# Patient Record
Sex: Male | Born: 1950 | Race: White | Hispanic: No | Marital: Married | Smoking: Never smoker
Health system: Southern US, Community
[De-identification: ages and names within clinical notes are randomized; demographics above are authoritative.]

## PROBLEM LIST (undated history)

## (undated) DIAGNOSIS — C61 Malignant neoplasm of prostate: Secondary | ICD-10-CM

## (undated) DIAGNOSIS — I10 Essential (primary) hypertension: Secondary | ICD-10-CM

## (undated) DIAGNOSIS — E785 Hyperlipidemia, unspecified: Secondary | ICD-10-CM

---

## 2020-01-15 ENCOUNTER — Inpatient Hospital Stay (HOSPITAL_COMMUNITY): Admission: EM | Disposition: E | Payer: Self-pay | Source: Home / Self Care | Attending: Critical Care Medicine

## 2020-01-15 ENCOUNTER — Encounter (HOSPITAL_COMMUNITY): Payer: Self-pay | Admitting: Critical Care Medicine

## 2020-01-15 ENCOUNTER — Inpatient Hospital Stay (HOSPITAL_COMMUNITY)
Admission: EM | Admit: 2020-01-15 | Discharge: 2020-02-14 | DRG: 246 | Disposition: E | Payer: Medicare Other | Attending: Critical Care Medicine | Admitting: Critical Care Medicine

## 2020-01-15 ENCOUNTER — Emergency Department (HOSPITAL_COMMUNITY): Payer: Medicare Other

## 2020-01-15 ENCOUNTER — Inpatient Hospital Stay (HOSPITAL_COMMUNITY): Payer: Medicare Other

## 2020-01-15 DIAGNOSIS — D649 Anemia, unspecified: Secondary | ICD-10-CM | POA: Diagnosis present

## 2020-01-15 DIAGNOSIS — N179 Acute kidney failure, unspecified: Secondary | ICD-10-CM | POA: Diagnosis present

## 2020-01-15 DIAGNOSIS — J9601 Acute respiratory failure with hypoxia: Secondary | ICD-10-CM | POA: Diagnosis present

## 2020-01-15 DIAGNOSIS — I4891 Unspecified atrial fibrillation: Secondary | ICD-10-CM | POA: Diagnosis present

## 2020-01-15 DIAGNOSIS — G40401 Other generalized epilepsy and epileptic syndromes, not intractable, with status epilepticus: Secondary | ICD-10-CM | POA: Diagnosis present

## 2020-01-15 DIAGNOSIS — I4901 Ventricular fibrillation: Secondary | ICD-10-CM

## 2020-01-15 DIAGNOSIS — Z9911 Dependence on respirator [ventilator] status: Secondary | ICD-10-CM | POA: Diagnosis not present

## 2020-01-15 DIAGNOSIS — E876 Hypokalemia: Secondary | ICD-10-CM | POA: Diagnosis not present

## 2020-01-15 DIAGNOSIS — Z20822 Contact with and (suspected) exposure to covid-19: Secondary | ICD-10-CM | POA: Diagnosis present

## 2020-01-15 DIAGNOSIS — I2109 ST elevation (STEMI) myocardial infarction involving other coronary artery of anterior wall: Principal | ICD-10-CM | POA: Diagnosis present

## 2020-01-15 DIAGNOSIS — E872 Acidosis: Secondary | ICD-10-CM | POA: Diagnosis present

## 2020-01-15 DIAGNOSIS — I5021 Acute systolic (congestive) heart failure: Secondary | ICD-10-CM | POA: Diagnosis present

## 2020-01-15 DIAGNOSIS — I959 Hypotension, unspecified: Secondary | ICD-10-CM | POA: Diagnosis not present

## 2020-01-15 DIAGNOSIS — I213 ST elevation (STEMI) myocardial infarction of unspecified site: Secondary | ICD-10-CM

## 2020-01-15 DIAGNOSIS — I469 Cardiac arrest, cause unspecified: Secondary | ICD-10-CM | POA: Diagnosis not present

## 2020-01-15 DIAGNOSIS — I110 Hypertensive heart disease with heart failure: Secondary | ICD-10-CM | POA: Diagnosis present

## 2020-01-15 DIAGNOSIS — N400 Enlarged prostate without lower urinary tract symptoms: Secondary | ICD-10-CM | POA: Diagnosis present

## 2020-01-15 DIAGNOSIS — R7303 Prediabetes: Secondary | ICD-10-CM | POA: Diagnosis present

## 2020-01-15 DIAGNOSIS — E778 Other disorders of glycoprotein metabolism: Secondary | ICD-10-CM | POA: Diagnosis present

## 2020-01-15 DIAGNOSIS — G931 Anoxic brain damage, not elsewhere classified: Secondary | ICD-10-CM | POA: Diagnosis present

## 2020-01-15 DIAGNOSIS — E8809 Other disorders of plasma-protein metabolism, not elsewhere classified: Secondary | ICD-10-CM | POA: Diagnosis present

## 2020-01-15 DIAGNOSIS — G40901 Epilepsy, unspecified, not intractable, with status epilepticus: Secondary | ICD-10-CM | POA: Diagnosis not present

## 2020-01-15 DIAGNOSIS — E782 Mixed hyperlipidemia: Secondary | ICD-10-CM | POA: Diagnosis present

## 2020-01-15 DIAGNOSIS — Z66 Do not resuscitate: Secondary | ICD-10-CM | POA: Diagnosis not present

## 2020-01-15 DIAGNOSIS — I2102 ST elevation (STEMI) myocardial infarction involving left anterior descending coronary artery: Secondary | ICD-10-CM | POA: Diagnosis not present

## 2020-01-15 DIAGNOSIS — R9431 Abnormal electrocardiogram [ECG] [EKG]: Secondary | ICD-10-CM | POA: Diagnosis present

## 2020-01-15 DIAGNOSIS — G253 Myoclonus: Secondary | ICD-10-CM | POA: Diagnosis not present

## 2020-01-15 DIAGNOSIS — I472 Ventricular tachycardia: Secondary | ICD-10-CM | POA: Diagnosis not present

## 2020-01-15 DIAGNOSIS — Z515 Encounter for palliative care: Secondary | ICD-10-CM

## 2020-01-15 DIAGNOSIS — Z8546 Personal history of malignant neoplasm of prostate: Secondary | ICD-10-CM

## 2020-01-15 DIAGNOSIS — I2510 Atherosclerotic heart disease of native coronary artery without angina pectoris: Secondary | ICD-10-CM

## 2020-01-15 DIAGNOSIS — Z79899 Other long term (current) drug therapy: Secondary | ICD-10-CM

## 2020-01-15 DIAGNOSIS — I462 Cardiac arrest due to underlying cardiac condition: Secondary | ICD-10-CM | POA: Diagnosis present

## 2020-01-15 HISTORY — DX: Malignant neoplasm of prostate: C61

## 2020-01-15 HISTORY — PX: CORONARY/GRAFT ACUTE MI REVASCULARIZATION: CATH118305

## 2020-01-15 HISTORY — PX: LEFT HEART CATH AND CORONARY ANGIOGRAPHY: CATH118249

## 2020-01-15 HISTORY — DX: Hyperlipidemia, unspecified: E78.5

## 2020-01-15 HISTORY — DX: Essential (primary) hypertension: I10

## 2020-01-15 HISTORY — PX: CORONARY STENT INTERVENTION: CATH118234

## 2020-01-15 LAB — CBC WITH DIFFERENTIAL/PLATELET
Abs Immature Granulocytes: 0.03 10*3/uL (ref 0.00–0.07)
Basophils Absolute: 0.1 10*3/uL (ref 0.0–0.1)
Basophils Relative: 1 %
Eosinophils Absolute: 0.1 10*3/uL (ref 0.0–0.5)
Eosinophils Relative: 1 %
HCT: 38.7 % — ABNORMAL LOW (ref 39.0–52.0)
Hemoglobin: 12.8 g/dL — ABNORMAL LOW (ref 13.0–17.0)
Immature Granulocytes: 0 %
Lymphocytes Relative: 31 %
Lymphs Abs: 2.8 10*3/uL (ref 0.7–4.0)
MCH: 32.5 pg (ref 26.0–34.0)
MCHC: 33.1 g/dL (ref 30.0–36.0)
MCV: 98.2 fL (ref 80.0–100.0)
Monocytes Absolute: 1 10*3/uL (ref 0.1–1.0)
Monocytes Relative: 11 %
Neutro Abs: 5.2 10*3/uL (ref 1.7–7.7)
Neutrophils Relative %: 56 %
Platelets: 132 10*3/uL — ABNORMAL LOW (ref 150–400)
RBC: 3.94 MIL/uL — ABNORMAL LOW (ref 4.22–5.81)
RDW: 13.8 % (ref 11.5–15.5)
WBC: 9.3 10*3/uL (ref 4.0–10.5)
nRBC: 0 % (ref 0.0–0.2)

## 2020-01-15 LAB — COMPREHENSIVE METABOLIC PANEL
ALT: 139 U/L — ABNORMAL HIGH (ref 0–44)
AST: 170 U/L — ABNORMAL HIGH (ref 15–41)
Albumin: 3.5 g/dL (ref 3.5–5.0)
Alkaline Phosphatase: 45 U/L (ref 38–126)
Anion gap: 9 (ref 5–15)
BUN: 34 mg/dL — ABNORMAL HIGH (ref 8–23)
CO2: 24 mmol/L (ref 22–32)
Calcium: 8.6 mg/dL — ABNORMAL LOW (ref 8.9–10.3)
Chloride: 106 mmol/L (ref 98–111)
Creatinine, Ser: 1.35 mg/dL — ABNORMAL HIGH (ref 0.61–1.24)
GFR calc Af Amer: >60 mL/min (ref >60)
GFR calc non Af Amer: 53 mL/min — ABNORMAL LOW (ref >60)
Glucose, Bld: 155 mg/dL — ABNORMAL HIGH (ref 70–99)
Potassium: 3.3 mmol/L — ABNORMAL LOW (ref 3.5–5.1)
Sodium: 139 mmol/L (ref 135–145)
Total Bilirubin: 0.5 mg/dL (ref 0.3–1.2)
Total Protein: 6.1 g/dL — ABNORMAL LOW (ref 6.5–8.1)

## 2020-01-15 LAB — POCT I-STAT EG7
Acid-base deficit: 3 mmol/L — ABNORMAL HIGH (ref 0.0–2.0)
Bicarbonate: 22.8 mmol/L (ref 20.0–28.0)
Calcium, Ion: 1.17 mmol/L (ref 1.15–1.40)
HCT: 36 % — ABNORMAL LOW (ref 39.0–52.0)
Hemoglobin: 12.2 g/dL — ABNORMAL LOW (ref 13.0–17.0)
O2 Saturation: 77 %
Patient temperature: 36.1
Potassium: 3.6 mmol/L (ref 3.5–5.1)
Sodium: 139 mmol/L (ref 135–145)
TCO2: 24 mmol/L (ref 22–32)
pCO2, Ven: 39.7 mmHg — ABNORMAL LOW (ref 44.0–60.0)
pH, Ven: 7.363 (ref 7.250–7.430)
pO2, Ven: 41 mmHg (ref 32.0–45.0)

## 2020-01-15 LAB — BASIC METABOLIC PANEL
Anion gap: 14 (ref 5–15)
BUN: 36 mg/dL — ABNORMAL HIGH (ref 8–23)
CO2: 21 mmol/L — ABNORMAL LOW (ref 22–32)
Calcium: 8.6 mg/dL — ABNORMAL LOW (ref 8.9–10.3)
Chloride: 103 mmol/L (ref 98–111)
Creatinine, Ser: 1.77 mg/dL — ABNORMAL HIGH (ref 0.61–1.24)
GFR calc Af Amer: 44 mL/min — ABNORMAL LOW (ref >60)
GFR calc non Af Amer: 38 mL/min — ABNORMAL LOW (ref >60)
Glucose, Bld: 255 mg/dL — ABNORMAL HIGH (ref 70–99)
Potassium: 3.5 mmol/L (ref 3.5–5.1)
Sodium: 138 mmol/L (ref 135–145)

## 2020-01-15 LAB — CBC
HCT: 37 % — ABNORMAL LOW (ref 39.0–52.0)
Hemoglobin: 12.1 g/dL — ABNORMAL LOW (ref 13.0–17.0)
MCH: 31.3 pg (ref 26.0–34.0)
MCHC: 32.7 g/dL (ref 30.0–36.0)
MCV: 95.9 fL (ref 80.0–100.0)
Platelets: 199 10*3/uL (ref 150–400)
RBC: 3.86 MIL/uL — ABNORMAL LOW (ref 4.22–5.81)
RDW: 13.9 % (ref 11.5–15.5)
WBC: 12.3 10*3/uL — ABNORMAL HIGH (ref 4.0–10.5)
nRBC: 0 % (ref 0.0–0.2)

## 2020-01-15 LAB — RESPIRATORY PANEL BY RT PCR (FLU A&B, COVID)
Influenza A by PCR: NEGATIVE
Influenza B by PCR: NEGATIVE
SARS Coronavirus 2 by RT PCR: NEGATIVE

## 2020-01-15 LAB — TROPONIN I (HIGH SENSITIVITY): Troponin I (High Sensitivity): 330 ng/L (ref <18)

## 2020-01-15 LAB — LACTIC ACID, PLASMA
Lactic Acid, Venous: 3.7 mmol/L (ref 0.5–1.9)
Lactic Acid, Venous: 2.8 mmol/L (ref 0.5–1.9)

## 2020-01-15 LAB — GLUCOSE, CAPILLARY
Glucose-Capillary: 223 mg/dL — ABNORMAL HIGH (ref 70–99)
Glucose-Capillary: 238 mg/dL — ABNORMAL HIGH (ref 70–99)

## 2020-01-15 LAB — APTT: aPTT: >200 seconds (ref 24–36)

## 2020-01-15 LAB — CBG MONITORING, ED: Glucose-Capillary: 147 mg/dL — ABNORMAL HIGH (ref 70–99)

## 2020-01-15 LAB — PROTIME-INR
INR: 1.1 (ref 0.8–1.2)
INR: 1.2 (ref 0.8–1.2)
Prothrombin Time: 13.6 seconds (ref 11.4–15.2)
Prothrombin Time: 15.1 seconds (ref 11.4–15.2)

## 2020-01-15 LAB — MAGNESIUM: Magnesium: 1.9 mg/dL (ref 1.7–2.4)

## 2020-01-15 SURGERY — CORONARY/GRAFT ACUTE MI REVASCULARIZATION
Anesthesia: LOCAL

## 2020-01-15 MED ORDER — TIROFIBAN HCL IV 12.5 MG/250 ML
0.0750 ug/kg/min | INTRAVENOUS | Status: AC
  Administered 2020-01-15: 0.075 ug/kg/min via INTRAVENOUS
  Filled 2020-01-15: qty 250

## 2020-01-15 MED ORDER — VERAPAMIL HCL 2.5 MG/ML IV SOLN
INTRAVENOUS | Status: DC | PRN
  Administered 2020-01-15: 10 mL via INTRA_ARTERIAL

## 2020-01-15 MED ORDER — HEPARIN SODIUM (PORCINE) 1000 UNIT/ML IJ SOLN
INTRAMUSCULAR | Status: DC | PRN
  Administered 2020-01-15: 3000 [IU] via INTRAVENOUS
  Administered 2020-01-15: 4000 [IU] via INTRAVENOUS

## 2020-01-15 MED ORDER — MAGNESIUM SULFATE 50 % IJ SOLN: 2.0000 g | Freq: Once | INTRAMUSCULAR | Status: DC

## 2020-01-15 MED ORDER — ORAL CARE MOUTH RINSE
15.0000 mL | OROMUCOSAL | Status: DC
  Administered 2020-01-15 – 2020-01-17 (×19): 15 mL via OROMUCOSAL

## 2020-01-15 MED ORDER — HEPARIN (PORCINE) 25000 UT/250ML-% IV SOLN
INTRAVENOUS | Status: AC
  Filled 2020-01-15: qty 250

## 2020-01-15 MED ORDER — HEPARIN BOLUS VIA INFUSION
4000.0000 [IU] | Freq: Once | INTRAVENOUS | Status: AC
  Administered 2020-01-15: 4000 [IU] via INTRAVENOUS

## 2020-01-15 MED ORDER — MAGNESIUM SULFATE 2 GM/50ML IV SOLN: 2.0000 g | Freq: Once | INTRAVENOUS | Status: AC

## 2020-01-15 MED ORDER — TIROFIBAN HCL IN NACL 5-0.9 MG/100ML-% IV SOLN
INTRAVENOUS | Status: AC
  Filled 2020-01-15: qty 100

## 2020-01-15 MED ORDER — INSULIN ASPART 100 UNIT/ML ~~LOC~~ SOLN
1.0000 [IU] | SUBCUTANEOUS | Status: DC
  Administered 2020-01-15: 3 [IU] via SUBCUTANEOUS
  Administered 2020-01-16: 1 [IU] via SUBCUTANEOUS
  Administered 2020-01-16: 3 [IU] via SUBCUTANEOUS
  Administered 2020-01-16 – 2020-01-17 (×3): 1 [IU] via SUBCUTANEOUS

## 2020-01-15 MED ORDER — NITROGLYCERIN 1 MG/10 ML FOR IR/CATH LAB
INTRA_ARTERIAL | Status: AC
  Filled 2020-01-15: qty 10

## 2020-01-15 MED ORDER — AMIODARONE HCL 150 MG/3ML IV SOLN
INTRAVENOUS | Status: AC | PRN
  Administered 2020-01-15: 300 mg via INTRAVENOUS

## 2020-01-15 MED ORDER — MIDAZOLAM HCL 2 MG/2ML IJ SOLN
INTRAMUSCULAR | Status: DC | PRN
  Administered 2020-01-15: 2 mg via INTRAVENOUS

## 2020-01-15 MED ORDER — VERAPAMIL HCL 2.5 MG/ML IV SOLN
INTRAVENOUS | Status: DC | PRN
  Administered 2020-01-15 (×3): 200 ug via INTRACORONARY

## 2020-01-15 MED ORDER — AMIODARONE HCL IN DEXTROSE 360-4.14 MG/200ML-% IV SOLN
INTRAVENOUS | Status: AC
  Filled 2020-01-15: qty 200

## 2020-01-15 MED ORDER — FENTANYL CITRATE (PF) 100 MCG/2ML IJ SOLN: 25.0000 ug | INTRAMUSCULAR | Status: DC | PRN

## 2020-01-15 MED ORDER — MAGNESIUM SULFATE 2 GM/50ML IV SOLN: 2.0000 g | Freq: Once | INTRAVENOUS | Status: DC

## 2020-01-15 MED ORDER — FENTANYL CITRATE (PF) 100 MCG/2ML IJ SOLN
25.0000 ug | INTRAMUSCULAR | Status: DC | PRN
  Administered 2020-01-15: 50 ug via INTRAVENOUS
  Filled 2020-01-15: qty 2

## 2020-01-15 MED ORDER — DOCUSATE SODIUM 50 MG/5ML PO LIQD
100.0000 mg | Freq: Two times a day (BID) | ORAL | Status: DC
  Administered 2020-01-16 – 2020-01-17 (×2): 100 mg
  Filled 2020-01-15 (×2): qty 10

## 2020-01-15 MED ORDER — SODIUM CHLORIDE 0.9 % IV SOLN
3.0000 g | Freq: Four times a day (QID) | INTRAVENOUS | Status: DC
  Administered 2020-01-15 – 2020-01-17 (×8): 3 g via INTRAVENOUS
  Filled 2020-01-15 (×2): qty 8
  Filled 2020-01-15 (×3): qty 3
  Filled 2020-01-15 (×6): qty 8
  Filled 2020-01-15: qty 3
  Filled 2020-01-15: qty 8

## 2020-01-15 MED ORDER — HEPARIN (PORCINE) 25000 UT/250ML-% IV SOLN
1000.0000 [IU]/h | INTRAVENOUS | Status: DC
  Filled 2020-01-15: qty 250

## 2020-01-15 MED ORDER — ASPIRIN 300 MG RE SUPP
300.0000 mg | Freq: Once | RECTAL | Status: AC
  Administered 2020-01-15: 300 mg via RECTAL

## 2020-01-15 MED ORDER — IOHEXOL 350 MG/ML SOLN
INTRAVENOUS | Status: AC
  Filled 2020-01-15: qty 1

## 2020-01-15 MED ORDER — TIROFIBAN HCL IN NACL 5-0.9 MG/100ML-% IV SOLN
INTRAVENOUS | Status: AC | PRN
  Administered 2020-01-15: 0.15 ug/kg/min via INTRAVENOUS

## 2020-01-15 MED ORDER — PROPOFOL 1000 MG/100ML IV EMUL
0.0000 ug/kg/min | INTRAVENOUS | Status: DC
  Administered 2020-01-15: 5 ug/kg/min via INTRAVENOUS
  Administered 2020-01-16 (×3): 35 ug/kg/min via INTRAVENOUS
  Administered 2020-01-16: 25 ug/kg/min via INTRAVENOUS
  Administered 2020-01-17: 40 ug/kg/min via INTRAVENOUS
  Administered 2020-01-17: 39.925 ug/kg/min via INTRAVENOUS
  Administered 2020-01-17 (×2): 40 ug/kg/min via INTRAVENOUS
  Filled 2020-01-15 (×2): qty 100
  Filled 2020-01-15: qty 200
  Filled 2020-01-15 (×6): qty 100

## 2020-01-15 MED ORDER — SODIUM CHLORIDE 0.9 % IV SOLN
2000.0000 mg | Freq: Once | INTRAVENOUS | Status: AC
  Administered 2020-01-15: 2000 mg via INTRAVENOUS
  Filled 2020-01-15: qty 20

## 2020-01-15 MED ORDER — EPINEPHRINE 1 MG/10ML IJ SOSY
PREFILLED_SYRINGE | INTRAMUSCULAR | Status: AC | PRN
  Administered 2020-01-15: 1 mg via INTRAVENOUS

## 2020-01-15 MED ORDER — PANTOPRAZOLE SODIUM 40 MG IV SOLR
40.0000 mg | Freq: Every day | INTRAVENOUS | Status: DC
  Administered 2020-01-15 – 2020-01-17 (×3): 40 mg via INTRAVENOUS
  Filled 2020-01-15 (×3): qty 40

## 2020-01-15 MED ORDER — LIDOCAINE HCL (PF) 1 % IJ SOLN
INTRAMUSCULAR | Status: AC
  Filled 2020-01-15: qty 30

## 2020-01-15 MED ORDER — NOREPINEPHRINE 4 MG/250ML-% IV SOLN: 0.0000 ug/min | INTRAVENOUS | Status: DC

## 2020-01-15 MED ORDER — VERAPAMIL HCL 2.5 MG/ML IV SOLN
INTRAVENOUS | Status: AC
  Filled 2020-01-15: qty 2

## 2020-01-15 MED ORDER — SODIUM CHLORIDE 0.9 % IV SOLN: INTRAVENOUS | Status: DC

## 2020-01-15 MED ORDER — DOCUSATE SODIUM 50 MG/5ML PO LIQD: 100.0000 mg | Freq: Two times a day (BID) | ORAL | Status: DC

## 2020-01-15 MED ORDER — HEPARIN SODIUM (PORCINE) 1000 UNIT/ML IJ SOLN
INTRAMUSCULAR | Status: AC
  Filled 2020-01-15: qty 1

## 2020-01-15 MED ORDER — ATORVASTATIN CALCIUM 40 MG PO TABS
40.0000 mg | ORAL_TABLET | Freq: Every day | ORAL | Status: DC
  Administered 2020-01-16 – 2020-01-17 (×2): 40 mg
  Filled 2020-01-15 (×2): qty 1

## 2020-01-15 MED ORDER — POLYETHYLENE GLYCOL 3350 17 G PO PACK: 17.0000 g | PACK | Freq: Every day | ORAL | Status: DC

## 2020-01-15 MED ORDER — MIDAZOLAM HCL 2 MG/2ML IJ SOLN
INTRAMUSCULAR | Status: AC
  Filled 2020-01-15: qty 2

## 2020-01-15 MED ORDER — CHLORHEXIDINE GLUCONATE 0.12% ORAL RINSE (MEDLINE KIT)
15.0000 mL | Freq: Two times a day (BID) | OROMUCOSAL | Status: DC
  Administered 2020-01-15 – 2020-01-17 (×4): 15 mL via OROMUCOSAL

## 2020-01-15 MED ORDER — HEPARIN (PORCINE) IN NACL 1000-0.9 UT/500ML-% IV SOLN
INTRAVENOUS | Status: DC | PRN
  Administered 2020-01-15 (×2): 500 mL

## 2020-01-15 MED ORDER — ASPIRIN 300 MG RE SUPP: 300.0000 mg | RECTAL | Status: DC

## 2020-01-15 MED ORDER — HEPARIN (PORCINE) 25000 UT/250ML-% IV SOLN
12.0000 [IU]/kg/h | INTRAVENOUS | Status: DC
  Administered 2020-01-15: 12 [IU]/kg/h via INTRAVENOUS

## 2020-01-15 MED ORDER — EPINEPHRINE 1 MG/10ML IJ SOSY
PREFILLED_SYRINGE | INTRAMUSCULAR | Status: AC | PRN
  Administered 2020-01-15 (×2): 1 mg via INTRAVENOUS

## 2020-01-15 MED ORDER — AMIODARONE HCL IN DEXTROSE 360-4.14 MG/200ML-% IV SOLN: 30.0000 mg/h | INTRAVENOUS | Status: DC

## 2020-01-15 MED ORDER — IOHEXOL 350 MG/ML SOLN
INTRAVENOUS | Status: DC | PRN
  Administered 2020-01-15: 120 mL

## 2020-01-15 MED ORDER — NITROGLYCERIN 1 MG/10 ML FOR IR/CATH LAB
INTRA_ARTERIAL | Status: DC | PRN
  Administered 2020-01-15: 100 ug via INTRACORONARY

## 2020-01-15 MED ORDER — HEPARIN SODIUM (PORCINE) 5000 UNIT/ML IJ SOLN: 5000.0000 [IU] | Freq: Three times a day (TID) | INTRAMUSCULAR | Status: DC

## 2020-01-15 MED ORDER — LEVETIRACETAM IN NACL 500 MG/100ML IV SOLN
500.0000 mg | Freq: Two times a day (BID) | INTRAVENOUS | Status: DC
  Administered 2020-01-16: 500 mg via INTRAVENOUS
  Filled 2020-01-15: qty 100

## 2020-01-15 MED ORDER — MAGNESIUM SULFATE 2 GM/50ML IV SOLN
INTRAVENOUS | Status: AC
  Administered 2020-01-15: 2 g via INTRAVENOUS
  Filled 2020-01-15: qty 50

## 2020-01-15 MED ORDER — TIROFIBAN (AGGRASTAT) BOLUS VIA INFUSION
INTRAVENOUS | Status: DC | PRN
  Administered 2020-01-15: 2212.5 ug via INTRAVENOUS

## 2020-01-15 MED ORDER — HEPARIN (PORCINE) IN NACL 1000-0.9 UT/500ML-% IV SOLN
INTRAVENOUS | Status: AC
  Filled 2020-01-15: qty 1000

## 2020-01-15 MED ORDER — AMIODARONE HCL IN DEXTROSE 360-4.14 MG/200ML-% IV SOLN
60.0000 mg/h | INTRAVENOUS | Status: DC
  Administered 2020-01-15: 60 mg/h via INTRAVENOUS

## 2020-01-15 MED ORDER — POTASSIUM CHLORIDE 10 MEQ/100ML IV SOLN
10.0000 meq | INTRAVENOUS | Status: AC
  Administered 2020-01-15 – 2020-01-16 (×4): 10 meq via INTRAVENOUS
  Filled 2020-01-15 (×4): qty 100

## 2020-01-15 MED ORDER — TICAGRELOR 90 MG PO TABS
180.0000 mg | ORAL_TABLET | Freq: Once | ORAL | Status: AC
  Administered 2020-01-15: 180 mg
  Filled 2020-01-15: qty 2

## 2020-01-15 MED ORDER — POLYETHYLENE GLYCOL 3350 17 G PO PACK
17.0000 g | PACK | Freq: Every day | ORAL | Status: DC
  Administered 2020-01-17: 17 g
  Filled 2020-01-15: qty 1

## 2020-01-15 SURGICAL SUPPLY — 20 items
BALLN  ~~LOC~~ SAPPHIRE 4.5X12 (BALLOONS) ×1
BALLN SAPPHIRE 2.5X12 (BALLOONS) ×2
BALLN ~~LOC~~ SAPPHIRE 4.5X12 (BALLOONS) ×1
BALLOON SAPPHIRE 2.5X12 (BALLOONS) ×1 IMPLANT
BALLOON ~~LOC~~ SAPPHIRE 4.5X12 (BALLOONS) ×1 IMPLANT
CATH 5FR JL3.5 JR4 ANG PIG MP (CATHETERS) ×2 IMPLANT
CATH LAUNCHER 6FR EBU3.5 (CATHETERS) ×2 IMPLANT
DEVICE RAD COMP TR BAND LRG (VASCULAR PRODUCTS) ×2 IMPLANT
GLIDESHEATH SLEND SS 6F .021 (SHEATH) ×2 IMPLANT
GUIDEWIRE INQWIRE 1.5J.035X260 (WIRE) ×1 IMPLANT
INQWIRE 1.5J .035X260CM (WIRE) ×2
KIT ENCORE 26 ADVANTAGE (KITS) ×2 IMPLANT
KIT HEART LEFT (KITS) ×2 IMPLANT
KIT HEMO VALVE WATCHDOG (MISCELLANEOUS) IMPLANT
PACK CARDIAC CATHETERIZATION (CUSTOM PROCEDURE TRAY) ×2 IMPLANT
STENT RESOLUTE ONYX 4.0X15 (Permanent Stent) ×2 IMPLANT
SYR MEDRAD MARK 7 150ML (SYRINGE) ×2 IMPLANT
TRANSDUCER W/STOPCOCK (MISCELLANEOUS) ×2 IMPLANT
TUBING CIL FLEX 10 FLL-RA (TUBING) ×2 IMPLANT
WIRE COUGAR XT STRL 190CM (WIRE) ×2 IMPLANT

## 2020-01-15 NOTE — Code Documentation (Signed)
c-collar applied  

## 2020-01-15 NOTE — Consult Note (Signed)
Cardiology Consultation:   Patient ID: Benjamin Montes MRN: [id number]; DOB: 1950/07/30   Admission date: 01/21/2020  Primary Care Provider: Pcp, No [facility] Cardiologist: No primary care provider on file.  [facility] Electrophysiologist:  None   Chief Complaint:  Cardiac Arrest  Patient Profile:   Benjamin Montes is a 69 y.o. male with no known medical history presents with out of hospital cardiac arrest, consultation requested by Dr Sabra Heck ([place])  History of Present Illness:   Benjamin Montes was reportedly hiking in the [facility] yesterday.  He is from [place] and was traveling with friends.  He had a fall yesterday and hit his face and head, but he sought evaluation at the local emergency department with a CAT scan showing no significant abnormality per report.  The patient was riding in the backseat of a car today when he suddenly became unresponsive.  His friends immediately called 38 and decided to drive to [facility] because they were only about 10 minutes away.  They were unable to administer CPR in route.  Immediately upon arrival at the emergency department front door, the patient was pulled from the car where he was administered CPR, given epinephrine, and defibrillated as his initial rhythm was ventricular fibrillation.  On his immediate post resuscitation EKG, there is diffuse ST segment elevation and a wide QRS complex.  A code STEMI is called.  I discussed the case with Dr. Sabra Heck in the emergency department and the patient was appropriately sent back to the CT scanner to rule out an intracranial hemorrhage.  This was negative.  A repeat EKG was performed once he was back from the CT scanner and his ST segments were isoelectric.  After review of the situation, I elected to proceed with the emergent cardiac catheterization with high suspicion for acute coronary occlusion as the culprit of his ventricular fibrillation cardiac arrest.  No other history is  obtainable at the time of my evaluation.  The patient presents to the cardiac catheterization lab at [facility] via EMS.  The patient is comatose, unable to provide any history.   Past Medical History:  Diagnosis Date  . Prostate cancer [facility])     History reviewed. No pertinent surgical history.   Medications Prior to Admission: Prior to Admission medications   Medication Sig Start Date End Date Taking? Authorizing Provider  amLODipine (NORVASC) 5 MG tablet Take 1 tablet by mouth daily.    [provider]  Cholecalciferol (VITAMIN D3) 1.25 MG (50000 UT) CAPS Take 1 tablet by mouth daily.    [provider]  finasteride (PROSCAR) 5 MG tablet Take 1 tablet by mouth daily.    [provider]  latanoprost (XALATAN) 0.005 % ophthalmic solution Place 1 drop into both eyes daily. 01/12/20   [provider]  lisinopril (ZESTRIL) 30 MG tablet Take 1 tablet by mouth daily.    [provider]  pravastatin (PRAVACHOL) 40 MG tablet Take 40 mg by mouth daily.    [provider]     Allergies:   Not on File  Social History:   Social History   Socioeconomic History  . Marital status: Married    Spouse name: Not on file  . Number of children: Not on file  . Years of education: Not on file  . Highest education level: Not on file  Occupational History  . Not on file  Tobacco Use  . Smoking status: Never Smoker  . Smokeless tobacco: Never Used  Substance and Sexual Activity  . Alcohol use: Not on file  . Drug use: Not on file  . Sexual activity: Not on file  Other Topics Concern  . Not on file  Social History Narrative  . Not on file   Social Determinants of Health   Financial Resource Strain:   . Difficulty of Paying Living Expenses: Not on file  Food Insecurity:   . Worried About Charity fundraiser in the Last Year: Not on file  . Ran Out of Food in the Last Year: Not on file  Transportation Needs:   . Lack of Transportation  (Medical): Not on file  . Lack of Transportation (Non-Medical): Not on file  Physical Activity:   . Days of Exercise per Week: Not on file  . Minutes of Exercise per Session: Not on file  Stress:   . Feeling of Stress : Not on file  Social Connections:   . Frequency of Communication with Friends and Family: Not on file  . Frequency of Social Gatherings with Friends and Family: Not on file  . Attends Religious Services: Not on file  . Active Member of Clubs or Organizations: Not on file  . Attends Archivist Meetings: Not on file  . Marital Status: Not on file  Intimate Partner Violence:   . Fear of Current or Ex-Partner: Not on file  . Emotionally Abused: Not on file  . Physically Abused: Not on file  . Sexually Abused: Not on file    Family History:   The patient's family history is not on file.  Unable to obtain secondary to the patient's clinical state.  ROS:  Please see the history of present illness.  Unable to obtain secondary to comatose patient  Physical Exam/Data:   Vitals:   01/22/2020 1728 01/16/2020 1738 01/16/2020 1743 02/07/2020 1748  BP: (!) 93/54 (!) 91/59 (!) 96/58 107/64  Pulse: (!) 52 (!) 51 (!) 56 (!) 54  Resp: 15 15 13 14   Temp:      SpO2: 100% 100% 100% 100%  Weight:      Height:       No intake or output data in the 24 hours ending 02/10/2020 1830 Last 3 Weights 01/22/2020  Weight (lbs) 195 lb  Weight (kg) 88.451 kg     Body mass index is 26.45 kg/m.  General: Unresponsive, c-collar in place HEENT: normal. Facial bruising noted Lymph: no adenopathy Neck: Unable to evaluate for JVD or carotid bruits with c-collar in place Vascular: unable to evaluate carotids. Pedal pulses 2+=; FA pulses 2+ bilaterally  Cardiac:  normal S1, S2; RRR; no murmur  Lungs:  clear to auscultation bilaterally, no wheezing, rhonchi or rales  Abd: soft, nontender, no hepatomegaly  Ext: no edema Musculoskeletal:  No deformities Skin: warm and dry  Neuro:   unresponsive Psych:  Unable to assess    EKG:  The ECG that was done was personally reviewed and demonstrates sinus bradycardia, prolonged QT, age undetermined anteroseptal infarct  Relevant CV Studies: Pending  Laboratory Data:  High Sensitivity Troponin:   Recent Labs  Lab 01/29/2020 1523  TROPONINIHS 330*      Chemistry Recent Labs  Lab 02/08/2020 1523  NA 139  K 3.3*  CL 106  CO2 24  GLUCOSE 155*  BUN 34*  CREATININE 1.35*  CALCIUM 8.6*  GFRNONAA 53*  GFRAA >60  ANIONGAP 9    Recent Labs  Lab 01/19/2020 1523  PROT 6.1*  ALBUMIN 3.5  AST 170*  ALT 139*  ALKPHOS 45  BILITOT 0.5   Hematology Recent Labs  Lab 01/16/2020 1523  WBC 9.3  RBC 3.94*  HGB 12.8*  HCT 38.7*  MCV 98.2  MCH 32.5  MCHC 33.1  RDW 13.8  PLT 132*   BNPNo results for input(s): BNP, PROBNP in the last 168 hours.  DDimer No results for input(s): DDIMER in the last 168 hours.   Radiology/Studies:  CT Head Wo Contrast  Result Date: 02/09/2020 CLINICAL DATA:  Status post fall with a blow to the head while hiking yesterday. The patient became unresponsive today. Initial encounter. EXAM: CT HEAD WITHOUT CONTRAST CT CERVICAL SPINE WITHOUT CONTRAST TECHNIQUE: Multidetector CT imaging of the head and cervical spine was performed following the standard protocol without intravenous contrast. Multiplanar CT image reconstructions of the cervical spine were also generated. COMPARISON:  None. FINDINGS: CT HEAD FINDINGS Brain: No evidence of acute infarction, hemorrhage, hydrocephalus, extra-axial collection or mass lesion/mass effect. Vascular: No hyperdense vessel or unexpected calcification. Skull: Normal. Negative for fracture or focal lesion. Sinuses/Orbits: Negative. Other: Endotracheal tube and OG tube noted. CT CERVICAL SPINE FINDINGS Alignment: Maintained. Skull base and vertebrae: No acute fracture. No primary bone lesion or focal pathologic process. Soft tissues and spinal canal: No prevertebral  fluid or swelling. No visible canal hematoma. Disc levels:  Loss of disc space height is seen from C3-C7. Upper chest: Lung apices clear. Other: None. IMPRESSION: Negative head CT. No acute abnormality cervical spondylosis. Multilevel degenerative disc disease. Electronically Signed   By: Inge Rise M.D.   On: 01/28/2020 15:51   CT Cervical Spine Wo Contrast  Result Date: 02/05/2020 CLINICAL DATA:  Status post fall with a blow to the head while hiking yesterday. The patient became unresponsive today. Initial encounter. EXAM: CT HEAD WITHOUT CONTRAST CT CERVICAL SPINE WITHOUT CONTRAST TECHNIQUE: Multidetector CT imaging of the head and cervical spine was performed following the standard protocol without intravenous contrast. Multiplanar CT image reconstructions of the cervical spine were also generated. COMPARISON:  None. FINDINGS: CT HEAD FINDINGS Brain: No evidence of acute infarction, hemorrhage, hydrocephalus, extra-axial collection or mass lesion/mass effect. Vascular: No hyperdense vessel or unexpected calcification. Skull: Normal. Negative for fracture or focal lesion. Sinuses/Orbits: Negative. Other: Endotracheal tube and OG tube noted. CT CERVICAL SPINE FINDINGS Alignment: Maintained. Skull base and vertebrae: No acute fracture. No primary bone lesion or focal pathologic process. Soft tissues and spinal canal: No prevertebral fluid or swelling. No visible canal hematoma. Disc levels:  Loss of disc space height is seen from C3-C7. Upper chest: Lung apices clear. Other: None. IMPRESSION: Negative head CT. No acute abnormality cervical spondylosis. Multilevel degenerative disc disease. Electronically Signed   By: Inge Rise M.D.   On: 02/12/2020 15:51   DG Chest Port 1 View  Result Date: 01/21/2020 CLINICAL DATA:  Tube placement EXAM: PORTABLE CHEST 1 VIEW COMPARISON:  None. FINDINGS: Endotracheal tube is positioned with tip below the thoracic inlet. Esophagogastric tube is position with tip  and side port below the diaphragm. Mild cardiomegaly. No acute abnormality of the lungs. IMPRESSION: 1. Endotracheal tube is positioned with tip below the thoracic inlet. 2. Esophagogastric tube is positioned with tip and side port below the diaphragm. 3. No acute abnormality of the lungs in AP portable projection. Electronically Signed   By: Eddie Candle M.D.   On: 01/27/2020 15:58    Assessment and Plan:   1. Ventricular fibrillation cardiac arrest: Patient with out of hospital cardiac arrest, unclear etiology.  History outlined  above as he had been out hiking in the [facility] with no symptoms, but suddenly arrested today while traveling by automobile.  Patient with ROSC with CPR, epinephrine, and defibrillation.  Immediate post resuscitation EKG suspicious for severe diffuse injury current/STEMI.  ST segments normalized on follow-up EKG.  Lactate of 3.7 suggestive of relatively favorable arrest scenario.  Considering the patient's good functional capacity and baseline health, ventricular fibrillation is initial rhythm, and relatively low lactate, I agree that we should proceed with emergency cardiac catheterization and possible PCI.  I have tried to contact the patient's friends/family from available telephone numbers in the medical record but I have not been able to get a hold of anyone to date.  We will proceed emergently.    For questions or updates, please contact [place] Please consult [email or web address] for contact info under     Signed, Sherren Mocha, MD  01/28/2020 6:30 PM

## 2020-01-15 NOTE — ED Notes (Signed)
ekg performed,

## 2020-01-15 NOTE — ED Notes (Signed)
Attempted to call [facility] ED charge for report x 3- unsuccessful; no answer

## 2020-01-15 NOTE — ED Triage Notes (Signed)
Pt arrived to er by pov, friends report that pt became unresponsive 15 minutes prior to arrival to er, pt was pulseless upon arrival, cpr started Dr Sabra Heck at bedside, friend reports that pt was hiking yesterday and fell, hitting his head, was seen at urgent care yesterday with negative ct head,

## 2020-01-15 NOTE — Code Documentation (Signed)
°  Patient Name: Jaiel Saraceno   MRN: [id number]   Date of Birth/ Sex: 02-23-1951 , male      Admission Date: 02/08/2020  Attending Provider: Julian Hy, DO  Primary Diagnosis: VF Arrest   Indication: Pt was in his usual state of health until this PM, when he was noted to be V. Fib. Code blue was subsequently called. At the time of arrival on scene, ACLS protocol was underway.   Technical Description:  - CPR performance duration:  2 minutes  - Was defibrillation or cardioversion used? No   - Was external pacer placed? Yes  - Was patient intubated pre/post CPR? Yes   Medications Administered: Y = Yes; Blank = No Amiodarone    Atropine    Calcium    Epinephrine    Lidocaine    Magnesium  Y  Norepinephrine    Phenylephrine    Sodium bicarbonate    Vasopressin     Post CPR evaluation:  - Final Status - Was patient successfully resuscitated ? Yes - What is current rhythm? NSR with PVCs - What is current hemodynamic status? Stable  Miscellaneous Information:  - Labs sent, including: Magnesium  - Primary team notified?  Yes  - Family Notified? Pending for Elink   - Additional notes/ transfer status: None      Gaylan Gerold, DO  02/02/2020, 9:16 PM

## 2020-01-15 NOTE — Progress Notes (Signed)
ANTICOAGULATION CONSULT NOTE - Initial Consult  Pharmacy Consult for heparin Indication: chest pain/ACS  Not on File  Patient Measurements: Height: 6' (182.9 cm) Weight: 88.5 kg (195 lb) IBW/kg (Calculated) : 77.6 Heparin Dosing Weight: TBW  Vital Signs: Temp: 97.2 F (36.2 C) (10/02 1545) BP: 107/64 (10/02 1748) Pulse Rate: 54 (10/02 1748)  Labs: Recent Labs    01/27/2020 1523 02/03/2020 1531  HGB 12.8*  --   HCT 38.7*  --   PLT 132*  --   LABPROT  --  13.6  INR  --  1.1  CREATININE 1.35*  --   TROPONINIHS 330*  --     Estimated Creatinine Clearance: 56.7 mL/min (A) (by C-G formula based on SCr of 1.35 mg/dL (H)).   Assessment: 25 YOM presented as code STEMI s/p CPR, now s/p cath and PCI to LAD.  Heparin bolus and gtt given in ED prior to cath, consulted to continue heparin.  Not on anticoagulation PTA, H/H 12.8/38.7, plts 132, did have a fall the day prior and has obvious hematomas to face.    Goal of Therapy:  Heparin level 0.3-0.7 units/ml Monitor platelets by anticoagulation protocol: Yes   Plan:  Heparin gtt at 1000 units/hr F/u 6 hour heparin level Daily heparin level, CBC, s/s bleeding  Bertis Ruddy, PharmD Clinical Pharmacist Please check AMION for all [place] numbers 02/13/2020 6:01 PM

## 2020-01-15 NOTE — Progress Notes (Signed)
   01/24/2020 2051  Clinical Encounter Type  Visited With Patient  Visit Type Code  Referral From Nurse  Consult/Referral To Chaplain   Chaplain responded to Code Blue. No family present and Pt currently being treated. Family is on their way. Chaplain spoke with Charge Nurse, Apolonio Schneiders, about paging when family arrives.  This note was prepared by Chaplain Resident, Dante Gang, MDiv. Chaplain remains available as needed through the on-call pager: [phone].

## 2020-01-15 NOTE — H&P (Signed)
NAME:  Benjamin Montes, MRN:  [id number], DOB:  [birth year], LOS: 0 ADMISSION DATE:  02/01/2020, CONSULTATION DATE:  10/2 REFERRING MD:  Burt Knack, CHIEF COMPLAINT:  VF arrest   Brief History   Benjamin Montes yesterday-  tripped and hit his head, negative head CT at [facility]. Unresponsive today in the car coming home, 15 min later arrived at [facility] in VF. LHC with DES to LAD.  History of present illness   Benjamin Montes is a 69 y/o gentleman from [place] with a history of hypertension who presented to the ED at [facility] on 10/2 after being unresponsive 15 minutes prior in the car with his friends.  He had a fall yesterday while hiking the [place], but was seen at urgent care and had a negative head CT scan.  When he arrived he was found to be in pulseless in ventricular fibrillation; ACLS was initiated.  ROSC was achieved after about 10 minutes. Initial EKG demonstrated ST elevations, but normalized to be isoelectric.  He was emergently transferred to [facility] for left heart catheterization.  He remains nonresponsive.   Per [name], he had been hiking for the past 2 weeks in the [place] and fell yesterday but never passed out. He continued to hike 2 more miles after his fall and went to the ED for evaluation last night due to facial injuries. He had 4 sutures to his lip and a negative head CT. He felt well overnight and this morning. He had sudden loss of consciousness while in the car and suddenly foaming at the mouth. He has not complained of dizziness, vision changes, chest pain, or other abnormal symptoms recently. He has a history of HTN and low-grade prostate cancer, which is treated medically. He is not on [facility]. No previous history of heart disease. He did not have a significant chest injury from his fall yesterday. No history of DM. His wife is flying to [employer] from [place].  [name]; wife [phone]   Past Medical History  Minimal medical records  available Hypertension Hyperlipidemia BPH  Significant Hospital Events     Consults:  Cardiology PCCM  Procedures:  ETT 10/2 [facility] 10/2  Significant Diagnostic Tests:  LHC 10/2>> DES to LAD  Micro Data:    Antimicrobials:  Ampicillin-sulbactam 10/2>  Interim history/subjective:    Objective   Blood pressure 107/64, pulse (!) 54, temperature (!) 97.2 F (36.2 C), resp. rate 14, height 6' (1.829 m), weight 88.5 kg, SpO2 100 %.    Vent Mode: PRVC FiO2 (%):  [60 %-100 %] 60 % Set Rate:  [18 bmp-20 bmp] 20 bmp Vt Set:  [500 mL-620 mL] 620 mL PEEP:  [5 cmH20] 5 cmH20 Plateau Pressure:  [11 cmH20-13 cmH20] 13 cmH20   Intake/Output Summary (Last 24 hours) at 01/16/2020 1847 Last data filed at 02/02/2020 1836 Gross per 24 hour  Intake --  Output 265 ml  Net -265 ml   Filed Weights   01/20/2020 1544  Weight: 88.5 kg    Examination: General: Critically ill appearing man intubated, not sedated. HENT: Facial injuries, eyes anicteric. Lungs: CTAB, no tracheal secretions. Cardiovascular: bradycardic, regular rhythm Abdomen: soft, NT Extremities: no peripheral edema Neuro: pupils small, not reactive. Occasional shrug of both shoulders with no stimulation. Flexion posturing bilateral UE with nailbed pressure, no LE response to nailbed pressure Derm: no rashes   Resolved Hospital Problem list     Assessment & Plan:  VF cardiac arrest due to STEMI -TTM  to 36 degrees x 24 hours -Neuroprotective measures-maintain normoxia, normocapnia, head of bed greater than 30 degrees, optimize electrolytes -Telemetry -Amiodarone bolus and infusion started -DAPT  -Minimize sedation to facilitate neuro prognostication -Optimize electrolytes-goal potassium 4-5 and magnesium greater than 2 Acute hypoxic respiratory failure due to cardiac arrest -Continue low tidal volume ventilation, 4 to 8 cc/kg ideal body weight goal plateau's and 30 driving pressure less than 15. -Daily SAT and SBT  as tolerated. -VAP prevention protocol -Empiric Unasyn given concern for likely aspiration during resuscitation  HLD -High intensity statin daily  H/o HTN -Holding PTA antihypertensives  Lactic acidosis due to hypoperfusion during cardiac arrest -Serial lactate levels to document improvement  Chronic anemia -Iron studies as an outpatient -Transfuse for hemoglobin less than 7 or hemodynamically significant bleeding  AKI versus CKD 3A -Monitor renal function -Renally dose meds and avoid nephrotoxic meds -Strict I/O -Goal for euvolemia  Transaminase elevation, likely due to hypoperfusion -Continue to monitor  Hypokalemia -Repletion -Serial monitoring  GoC -Wife and son in law updated via phone. Flying into [place] from [place]. They understand that he has not yet woken up and it is too early to neuroprognosticate, but it is possible that he may have had a devastating neurologic injury.  We are continuing all aggressive care measures.   Best practice:  Diet: NPO Pain/Anxiety/Delirium protocol (if indicated): fentanyl PRN VAP protocol (if indicated): yes DVT prophylaxis: heparin [place] GI prophylaxis: pantoprazole Glucose control: SSI Mobility: bedrest Code Status: full Family Communication: wife updated via phone Disposition: ICU  Labs   CBC: Recent Labs  Lab 01/22/2020 1523  WBC 9.3  NEUTROABS 5.2  HGB 12.8*  HCT 38.7*  MCV 98.2  PLT 132*    Basic Metabolic Panel: Recent Labs  Lab 02/04/2020 1523  NA 139  K 3.3*  CL 106  CO2 24  GLUCOSE 155*  BUN 34*  CREATININE 1.35*  CALCIUM 8.6*   GFR: Estimated Creatinine Clearance: 56.7 mL/min (A) (by C-G formula based on SCr of 1.35 mg/dL (H)). Recent Labs  Lab 02/07/2020 1523 02/01/2020 1531  WBC 9.3  --   LATICACIDVEN  --  3.7*    Liver Function Tests: Recent Labs  Lab 02/03/2020 1523  AST 170*  ALT 139*  ALKPHOS 45  BILITOT 0.5  PROT 6.1*  ALBUMIN 3.5   No results for input(s): LIPASE,  AMYLASE in the last 168 hours. No results for input(s): AMMONIA in the last 168 hours.  ABG No results found for: PHART, PCO2ART, PO2ART, HCO3, TCO2, ACIDBASEDEF, O2SAT   Coagulation Profile: Recent Labs  Lab 02/10/2020 1531  INR 1.1    Cardiac Enzymes: No results for input(s): CKTOTAL, CKMB, CKMBINDEX, TROPONINI in the last 168 hours.  HbA1C: No results found for: HGBA1C  CBG: Recent Labs  Lab 01/30/2020 1518  GLUCAP 147*    Review of Systems:   Unable to be obtained due to mental status  Past Medical History  He,  has a past medical history of Hyperlipidemia, Hypertension, and Prostate cancer ([place]).   Surgical History   History reviewed. No pertinent surgical history.   Social History   reports that he has never smoked. He has never used smokeless tobacco.   Family History   His family history is not on file.   Allergies Not on File   Home Medications  Prior to Admission medications   Medication Sig Start Date End Date Taking? Authorizing Provider  amLODipine (NORVASC) 5 MG tablet Take 1 tablet by mouth daily.  [provider]  Cholecalciferol (VITAMIN D3) 1.25 MG (50000 UT) CAPS Take 1 tablet by mouth daily.    [provider]  finasteride (PROSCAR) 5 MG tablet Take 1 tablet by mouth daily.    [provider]  latanoprost (XALATAN) 0.005 % ophthalmic solution Place 1 drop into both eyes daily. 01/12/20   [provider]  lisinopril (ZESTRIL) 30 MG tablet Take 1 tablet by mouth daily.    [provider]  pravastatin (PRAVACHOL) 40 MG tablet Take 40 mg by mouth daily.    [provider]      This patient is critically ill with multiple organ system failure which requires frequent high complexity decision making, assessment, support, evaluation, and titration of therapies. This was completed through the application of advanced monitoring technologies and extensive interpretation of multiple databases. During  this encounter critical care time was devoted to patient care services described in this note for 50 minutes.   Julian Hy, DO 01/16/2020 6:47 PM [facility]

## 2020-01-15 NOTE — ED Notes (Signed)
Pt arrives to ED via [facility] from [facility] ER. Pt arrives to [facility] ED and cath lab ready so pt immediately transported to cath lab.

## 2020-01-15 NOTE — ED Notes (Signed)
Pt off unit by [facility] for transport to [facility] ED

## 2020-01-15 NOTE — Code Documentation (Signed)
Return of pulses, Dr Sabra Heck remains at bedside,

## 2020-01-15 NOTE — ED Provider Notes (Signed)
[name] EMERGENCY DEPARTMENT Provider Note   CSN: [id number] Arrival date & time: 02/08/2020  1503     History Chief Complaint  Patient presents with   Cardiac Arrest    Benjamin Montes is a 69 y.o. male.  HPI   Is a 69 year old male, he has a reported history of hypertension according to the friends that are with him.  They were out of state in [place] hiking on the [place] when he had a fall yesterday bumping the left side of his face causing a laceration to his upper lip.  He was seen at a local emergency department and had a CT scan though they were unsure whether it was just his face or his brain.  He had no complaints of headache or neck pain.  He was in his usual state of health this morning with no complaints, ambulatory when they got in the car to continue driving.  Approximately 15 minutes prior to the arrival in the emergency department by private vehicle the patient went unresponsive.  The patient is unable to answer any questions, he is completely obtunded apneic and has a GCS of 3  No past medical history on file.  There are no problems to display for this patient.   History :  Htn     No family history on file.  Social History   Tobacco Use   Smoking status: Not on file  Substance Use Topics   Alcohol use: Not on file   Drug use: Not on file    Home Medications Prior to Admission medications   Medication Sig Start Date End Date Taking? Authorizing Provider  amLODipine (NORVASC) 5 MG tablet Take 1 tablet by mouth daily.    [provider]  Cholecalciferol (VITAMIN D3) 1.25 MG (50000 UT) CAPS Take 1 tablet by mouth daily.    [provider]  finasteride (PROSCAR) 5 MG tablet Take 1 tablet by mouth daily.    [provider]  latanoprost (XALATAN) 0.005 % ophthalmic solution Place 1 drop into both eyes daily. 01/12/20   [provider]  lisinopril (ZESTRIL) 30 MG tablet Take 1 tablet by mouth daily.     [provider]  pravastatin (PRAVACHOL) 40 MG tablet Take 40 mg by mouth daily.    [provider]    Allergies    Patient has no allergy information on record.  Review of Systems   Review of Systems  Unable to perform ROS: Acuity of condition    Physical Exam Updated Vital Signs BP 97/78    Pulse (!) 38    Temp (!) 96.3 F (35.7 C)    Resp 19    SpO2 97%   Physical Exam Constitutional:      General: He is in acute distress.     Appearance: He is toxic-appearing and diaphoretic.     Comments: Pale diaphoretic, ill-appearing  HENT:     Head:     Comments: Periorbital bruising on the left    Mouth/Throat:     Mouth: Mucous membranes are moist.     Pharynx: No oropharyngeal exudate or posterior oropharyngeal erythema.  Eyes:     Comments: Pupils are 5 and 6 mm respectively, they are nonreactive  Neck:     Comments: Cervical spine immobilized on arrival. Cardiovascular:     Comments: No pulses, no inherent cardiac activity, ventricular fibrillation on monitor Pulmonary:     Comments: No spontaneous respirations, respirations were assisted with bag-valve-mask Abdominal:  Comments: Soft abdomen, no masses  Genitourinary:    Comments: Normal-appearing external genitalia Musculoskeletal:     Comments: All 4 extremities appear normal, no signs of trauma, supple joints and soft compartments  Skin:    Comments: Pale and diaphoretic, bruising around the left eye  Neurological:     Comments: GCS of 3     ED Results / Procedures / Treatments   Labs (all labs ordered are listed, but only abnormal results are displayed) Labs Reviewed  LACTIC ACID, PLASMA - Abnormal; Notable for the following components:      Result Value   Lactic Acid, Venous 3.7 (*)    All other components within normal limits  CBG MONITORING, ED - Abnormal; Notable for the following components:   Glucose-Capillary 147 (*)    All other components within normal limits  RESPIRATORY  PANEL BY RT PCR (FLU A&B, COVID)  PROTIME-INR  CBC WITH DIFFERENTIAL/PLATELET  COMPREHENSIVE METABOLIC PANEL  TROPONIN I (HIGH SENSITIVITY)    EKG EKG Interpretation  Date/Time:  Saturday January 15 2020 15:14:25 EDT Ventricular Rate:  112 PR Interval:    QRS Duration: 181 QT Interval:  420 QTC Calculation: 574 R Axis:   75 Text Interpretation: Sinus tachycardia Atrial premature complex Nonspecific intraventricular conduction delay Borderline repol abnrm, anterolateral leads wide complex, possible injury pattern No old tracing to compare Confirmed by Noemi Chapel [phone]) on 01/24/2020 3:59:48 PM   Radiology CT Head Wo Contrast  Result Date: 02/10/2020 CLINICAL DATA:  Status post fall with a blow to the head while hiking yesterday. The patient became unresponsive today. Initial encounter. EXAM: CT HEAD WITHOUT CONTRAST CT CERVICAL SPINE WITHOUT CONTRAST TECHNIQUE: Multidetector CT imaging of the head and cervical spine was performed following the standard protocol without intravenous contrast. Multiplanar CT image reconstructions of the cervical spine were also generated. COMPARISON:  None. FINDINGS: CT HEAD FINDINGS Brain: No evidence of acute infarction, hemorrhage, hydrocephalus, extra-axial collection or mass lesion/mass effect. Vascular: No hyperdense vessel or unexpected calcification. Skull: Normal. Negative for fracture or focal lesion. Sinuses/Orbits: Negative. Other: Endotracheal tube and OG tube noted. CT CERVICAL SPINE FINDINGS Alignment: Maintained. Skull base and vertebrae: No acute fracture. No primary bone lesion or focal pathologic process. Soft tissues and spinal canal: No prevertebral fluid or swelling. No visible canal hematoma. Disc levels:  Loss of disc space height is seen from C3-C7. Upper chest: Lung apices clear. Other: None. IMPRESSION: Negative head CT. No acute abnormality cervical spondylosis. Multilevel degenerative disc disease. Electronically Signed   By: Inge Rise M.D.   On: 01/24/2020 15:51   CT Cervical Spine Wo Contrast  Result Date: 02/01/2020 CLINICAL DATA:  Status post fall with a blow to the head while hiking yesterday. The patient became unresponsive today. Initial encounter. EXAM: CT HEAD WITHOUT CONTRAST CT CERVICAL SPINE WITHOUT CONTRAST TECHNIQUE: Multidetector CT imaging of the head and cervical spine was performed following the standard protocol without intravenous contrast. Multiplanar CT image reconstructions of the cervical spine were also generated. COMPARISON:  None. FINDINGS: CT HEAD FINDINGS Brain: No evidence of acute infarction, hemorrhage, hydrocephalus, extra-axial collection or mass lesion/mass effect. Vascular: No hyperdense vessel or unexpected calcification. Skull: Normal. Negative for fracture or focal lesion. Sinuses/Orbits: Negative. Other: Endotracheal tube and OG tube noted. CT CERVICAL SPINE FINDINGS Alignment: Maintained. Skull base and vertebrae: No acute fracture. No primary bone lesion or focal pathologic process. Soft tissues and spinal canal: No prevertebral fluid or swelling. No visible canal hematoma. Disc levels:  Loss of  disc space height is seen from C3-C7. Upper chest: Lung apices clear. Other: None. IMPRESSION: Negative head CT. No acute abnormality cervical spondylosis. Multilevel degenerative disc disease. Electronically Signed   By: Inge Rise M.D.   On: 02/08/2020 15:51    Procedures Procedure Name: Intubation Date/Time: 01/21/2020 3:54 PM Performed by: Noemi Chapel, MD Pre-anesthesia Checklist: Patient identified, Patient being monitored, Emergency Drugs available, Timeout performed and Suction available Oxygen Delivery Method: Non-rebreather mask Preoxygenation: Pre-oxygenation with 100% oxygen Ventilation: Mask ventilation without difficulty Laryngoscope Size: Mac and 4 Tube size: 8.0 mm Number of attempts: 1 Airway Equipment and Method: Stylet Placement Confirmation: ETT inserted  through vocal cords under direct vision,  CO2 detector and Breath sounds checked- equal and bilateral Secured at: 25 cm Tube secured with: Tape Dental Injury: Teeth and Oropharynx as per pre-operative assessment  Difficulty Due To: Difficulty was unanticipated Comments:      .Cardioversion  Date/Time: 01/19/2020 3:54 PM Performed by: Noemi Chapel, MD Authorized by: Noemi Chapel, MD   Consent:    Consent obtained:  Emergent situation Pre-procedure details:    Cardioversion basis:  Emergent   Pre-procedure rhythm: Vetricular Fibrillation.   Electrode placement:  Anterior-posterior Patient sedated: No Attempt one:    Cardioversion mode:  Synchronous   Waveform:  Biphasic   Shock (Joules):  200   Shock outcome:  No change in rhythm Attempt two:    Cardioversion mode:  Synchronous   Waveform:  Biphasic   Shock (Joules):  200   Shock outcome:  No change in rhythm Attempt three:    Cardioversion mode:  Synchronous   Waveform:  Biphasic   Shock (Joules):  200   Shock outcome:  Conversion to normal sinus rhythm Post-procedure details:    Patient status:  Unresponsive   Patient tolerance of procedure:  Tolerated well, no immediate complications Comments:         CPR  Date/Time: 02/12/2020 3:55 PM Performed by: Noemi Chapel, MD Authorized by: Noemi Chapel, MD  CPR Procedure Details:    ACLS/BLS initiated by EMS: No     CPR/ACLS performed in the ED: Yes     Duration of CPR (minutes):  10   Outcome: ROSC obtained    CPR performed via ACLS guidelines under my direct supervision.  See RN documentation for details including defibrillator use, medications, doses and timing. Comments:     Continuous CPR for approximately 10 minutes. .Critical Care Performed by: Noemi Chapel, MD Authorized by: Noemi Chapel, MD   Critical care provider statement:    Critical care time (minutes):  35   Critical care time was exclusive of:  Separately billable procedures and treating  other patients and teaching time   Critical care was necessary to treat or prevent imminent or life-threatening deterioration of the following conditions:  Cardiac failure   Critical care was time spent personally by me on the following activities:  Blood draw for specimens, development of treatment plan with patient or surrogate, discussions with consultants, evaluation of patient's response to treatment, examination of patient, obtaining history from patient or surrogate, ordering and performing treatments and interventions, ordering and review of laboratory studies, ordering and review of radiographic studies, pulse oximetry, re-evaluation of patient's condition and review of old charts   (including critical care time)  Medications Ordered in ED Medications  heparin ADULT infusion 100 units/mL (25000 units/261m sodium chloride 0.45%) (12 Units/kg/hr  88.5 kg Intravenous New Bag/Given 02/05/2020 1553)  amiodarone (NEXTERONE PREMIX) 360-4.14 MG/200ML-% (1.8 mg/mL) IV infusion (60  mg/hr Intravenous New Bag/Given 02/10/2020 1545)  amiodarone (NEXTERONE PREMIX) 360-4.14 MG/200ML-% (1.8 mg/mL) IV infusion (has no administration in time range)  amiodarone (CORDARONE) injection (300 mg Intravenous Given 02/13/2020 1507)  EPINEPHrine (ADRENALIN) 1 MG/10ML injection (1 mg Intravenous Given 01/20/2020 1520)  heparin bolus via infusion 4,000 Units (4,000 Units Intravenous Bolus from Bag 02/09/2020 1554)  EPINEPHrine (ADRENALIN) 1 MG/10ML injection (1 mg Intravenous Given 01/28/2020 1556)    ED Course  I have reviewed the triage vital signs and the nursing notes.  Pertinent labs & imaging results that were available during my care of the patient were reviewed by me and considered in my medical decision making (see chart for details).    MDM Rules/Calculators/A&P                          CPR was initiated as we extracted the patient from the vehicle in the parking lot.  He was placed on a stretcher and CPR was  continued continuously for approximately 10 minutes.  During that timeframe the patient received oxygen by bag-valve-mask.    CPR was continued constantly even through the defibrillation events x3, I personally performed CPR as well as directed CPR.  I intubated the patient on the first attempt with a MAC 4 blade and an 8.0 tube epinephrine was dosed times a single milligram, amiodarone 300 mg was given  Return of spontaneous circulation was achieved, portable chest x-ray obtained as well as CT scan of the brain to make sure there is no hemorrhage prior to starting heparin.  EKG obtained at 3:14 PM on October 2 showed wide-complex QRS with possible injury pattern though not completely diagnostic as this could just be a reperfusion pattern as well.  Repeat EKG performed at 3:48 PM when the patient returned from CT scan shows no significant signs of ST elevation, no arrhythmia, narrow complex QRS.  This patient is critically ill, the cause of his ventricular fibrillation arrest is unclear however he appears to have return of spontaneous circulation.  The patient started the cooling process with ice in the groin in the axillary region as he was placed onto the EMS stretcher to be transferred hospital to hospital to [facility] where he has been accepted by Dr. Blanchie Dessert in the emergency department and Dr. Sherren Mocha of the cardiology service who will see him on arrival.  I updated the spouse by phone, she is aware of the patient's critical condition.  Final Clinical Impression(s) / ED Diagnoses Final diagnoses:  Ventricular fibrillation [facility])  Cardiac arrest [facility])      Noemi Chapel, MD 01/20/2020 [phone]

## 2020-01-15 NOTE — ED Notes (Signed)
324mg  rectal aspirin given

## 2020-01-15 NOTE — Progress Notes (Addendum)
eLink Physician-Brief Progress Note Patient Name: Benjamin Montes DOB: 04-Nov-1950 MRN: [id number]   Date of Service  02/03/2020  HPI/Events of Note  I was called to room because patient was reportedly having torsades de pointes on the monitor. By the time I was able to camera into the room this had resolved though not prior to a brief period of CPR while pads/LifePak was being placed on patient.  Patient is s/p VFib arrest earlier today for which he went to cath lab with placement of stent to LAD.  Of note, the patient is now exhibiting frank post-arrest myoclonus manifest as symmetric UE violent but brief jerking activity. He is doing this off of any sedation / burst-suppression agents.   eICU Interventions  Ordered 2g MgSO4 via slow IV push for torsades.  On review of earlier EKGs (done prior to revascularization) he does appear to have a prolonged QTc, consistent with increased propensity for torsades.  Repeat EKG now and Mg level now.  Potassium also mildly decreased at 3.5 so this is being repleted now as well.  Start propofol infusion for burst-suppression in setting of post-arrest myoclonic activity. Will also order 2g Keppra IV and maintenance dosing as well.  Will call family to notify them of this deterioration and advise them to come directly to the hospital when their flight gets in.   ADDENDUM: - Family updated. They will be arriving in about 15 minutes.  - Contact numbers for family are as follows: -- Danford Bad (Wife): [phone] -- Clarene Critchley (Daughter): [phone]  Intervention Category Major Interventions: Code management / supervision  Marily Lente Mally Gavina 02/08/2020, 9:17 PM

## 2020-01-15 NOTE — Progress Notes (Signed)
Pharmacy Antibiotic Note  Benjamin Montes is a 69 y.o. male admitted on 01/27/2020 as code STEMI, concern for aspiration during resuscitation.  Pharmacy has been consulted for Unasyn dosing.  Plan: Unasyn 3g IV every 6 hours Monitor renal function, clinical progression and LOT  Height: 6' (182.9 cm) Weight: 88.5 kg (195 lb) IBW/kg (Calculated) : 77.6  Temp (24hrs), Avg:95.7 F (35.4 C), Min:93.9 F (34.4 C), Max:97.2 F (36.2 C)  Recent Labs  Lab 01/18/2020 1523 01/19/2020 1531  WBC 9.3  --   CREATININE 1.35*  --   LATICACIDVEN  --  3.7*    Estimated Creatinine Clearance: 56.7 mL/min (A) (by C-G formula based on SCr of 1.35 mg/dL (H)).    Not on File  Bertis Ruddy, PharmD Clinical Pharmacist Please check AMION for all [place] numbers 02/05/2020 5:26 PM

## 2020-01-15 NOTE — ED Notes (Signed)
Date and time results received: 01/31/2020 1600  Test: LA Critical Value: 3.7 Name of Provider Notified: Sabra Heck  Orders Received? Or Actions Taken?: N/A

## 2020-01-16 ENCOUNTER — Inpatient Hospital Stay (HOSPITAL_COMMUNITY): Payer: Medicare Other

## 2020-01-16 DIAGNOSIS — G253 Myoclonus: Secondary | ICD-10-CM

## 2020-01-16 DIAGNOSIS — R9431 Abnormal electrocardiogram [ECG] [EKG]: Secondary | ICD-10-CM

## 2020-01-16 DIAGNOSIS — I472 Ventricular tachycardia: Secondary | ICD-10-CM

## 2020-01-16 DIAGNOSIS — I469 Cardiac arrest, cause unspecified: Secondary | ICD-10-CM

## 2020-01-16 DIAGNOSIS — J9601 Acute respiratory failure with hypoxia: Secondary | ICD-10-CM

## 2020-01-16 DIAGNOSIS — I2109 ST elevation (STEMI) myocardial infarction involving other coronary artery of anterior wall: Principal | ICD-10-CM

## 2020-01-16 LAB — ECHOCARDIOGRAM COMPLETE
AR max vel: 2.87 cm2
AV Area VTI: 2.73 cm2
AV Area mean vel: 2.65 cm2
AV Mean grad: 1 mmHg
AV Peak grad: 2.5 mmHg
Ao pk vel: 0.79 m/s
Area-P 1/2: 4.21 cm2
Calc EF: 49.6 %
Height: 72 in
S' Lateral: 3 cm
Single Plane A2C EF: 54.6 %
Single Plane A4C EF: 47.2 %
Weight: 3037.06 oz

## 2020-01-16 LAB — CBC
HCT: 35.5 % — ABNORMAL LOW (ref 39.0–52.0)
HCT: 41.6 % (ref 39.0–52.0)
Hemoglobin: 11.7 g/dL — ABNORMAL LOW (ref 13.0–17.0)
Hemoglobin: 13.7 g/dL (ref 13.0–17.0)
MCH: 31.8 pg (ref 26.0–34.0)
MCH: 31.8 pg (ref 26.0–34.0)
MCHC: 33 g/dL (ref 30.0–36.0)
MCHC: 32.9 g/dL (ref 30.0–36.0)
MCV: 96.5 fL (ref 80.0–100.0)
MCV: 96.5 fL (ref 80.0–100.0)
Platelets: 152 10*3/uL (ref 150–400)
Platelets: 205 10*3/uL (ref 150–400)
RBC: 3.68 MIL/uL — ABNORMAL LOW (ref 4.22–5.81)
RBC: 4.31 MIL/uL (ref 4.22–5.81)
RDW: 13.9 % (ref 11.5–15.5)
RDW: 14.2 % (ref 11.5–15.5)
WBC: 10.8 10*3/uL — ABNORMAL HIGH (ref 4.0–10.5)
WBC: 15.9 10*3/uL — ABNORMAL HIGH (ref 4.0–10.5)
nRBC: 0 % (ref 0.0–0.2)
nRBC: 0 % (ref 0.0–0.2)

## 2020-01-16 LAB — GLUCOSE, CAPILLARY
Glucose-Capillary: 90 mg/dL (ref 70–99)
Glucose-Capillary: 98 mg/dL (ref 70–99)
Glucose-Capillary: 128 mg/dL — ABNORMAL HIGH (ref 70–99)
Glucose-Capillary: 134 mg/dL — ABNORMAL HIGH (ref 70–99)
Glucose-Capillary: 119 mg/dL — ABNORMAL HIGH (ref 70–99)

## 2020-01-16 LAB — COMPREHENSIVE METABOLIC PANEL
ALT: 131 U/L — ABNORMAL HIGH (ref 0–44)
AST: 157 U/L — ABNORMAL HIGH (ref 15–41)
Albumin: 3.1 g/dL — ABNORMAL LOW (ref 3.5–5.0)
Alkaline Phosphatase: 37 U/L — ABNORMAL LOW (ref 38–126)
Anion gap: 13 (ref 5–15)
BUN: 33 mg/dL — ABNORMAL HIGH (ref 8–23)
CO2: 19 mmol/L — ABNORMAL LOW (ref 22–32)
Calcium: 8.7 mg/dL — ABNORMAL LOW (ref 8.9–10.3)
Chloride: 106 mmol/L (ref 98–111)
Creatinine, Ser: 1.51 mg/dL — ABNORMAL HIGH (ref 0.61–1.24)
GFR calc Af Amer: 54 mL/min — ABNORMAL LOW (ref >60)
GFR calc non Af Amer: 46 mL/min — ABNORMAL LOW (ref >60)
Glucose, Bld: 122 mg/dL — ABNORMAL HIGH (ref 70–99)
Potassium: 3.9 mmol/L (ref 3.5–5.1)
Sodium: 138 mmol/L (ref 135–145)
Total Bilirubin: 1.1 mg/dL (ref 0.3–1.2)
Total Protein: 5.4 g/dL — ABNORMAL LOW (ref 6.5–8.1)

## 2020-01-16 LAB — BASIC METABOLIC PANEL
Anion gap: 14 (ref 5–15)
BUN: 31 mg/dL — ABNORMAL HIGH (ref 8–23)
CO2: 19 mmol/L — ABNORMAL LOW (ref 22–32)
Calcium: 8.8 mg/dL — ABNORMAL LOW (ref 8.9–10.3)
Chloride: 106 mmol/L (ref 98–111)
Creatinine, Ser: 1.48 mg/dL — ABNORMAL HIGH (ref 0.61–1.24)
GFR calc Af Amer: 55 mL/min — ABNORMAL LOW (ref >60)
GFR calc non Af Amer: 48 mL/min — ABNORMAL LOW (ref >60)
Glucose, Bld: 156 mg/dL — ABNORMAL HIGH (ref 70–99)
Potassium: 3.5 mmol/L (ref 3.5–5.1)
Sodium: 139 mmol/L (ref 135–145)

## 2020-01-16 LAB — MAGNESIUM
Magnesium: 2.1 mg/dL (ref 1.7–2.4)
Magnesium: 2.5 mg/dL — ABNORMAL HIGH (ref 1.7–2.4)

## 2020-01-16 LAB — TROPONIN I (HIGH SENSITIVITY)
Troponin I (High Sensitivity): 2135 ng/L (ref <18)
Troponin I (High Sensitivity): 1925 ng/L (ref <18)

## 2020-01-16 LAB — TRIGLYCERIDES: Triglycerides: 61 mg/dL (ref <150)

## 2020-01-16 LAB — MRSA PCR SCREENING: MRSA by PCR: NEGATIVE

## 2020-01-16 LAB — PHOSPHORUS: Phosphorus: 3.1 mg/dL (ref 2.5–4.6)

## 2020-01-16 LAB — LACTIC ACID, PLASMA: Lactic Acid, Venous: 3.4 mmol/L (ref 0.5–1.9)

## 2020-01-16 MED ORDER — MIDAZOLAM BOLUS VIA INFUSION
4.0000 mg | Freq: Once | INTRAVENOUS | Status: AC
  Administered 2020-01-16: 4 mg via INTRAVENOUS

## 2020-01-16 MED ORDER — SODIUM CHLORIDE 0.9% FLUSH
3.0000 mL | Freq: Two times a day (BID) | INTRAVENOUS | Status: DC
  Administered 2020-01-16 – 2020-01-17 (×3): 3 mL via INTRAVENOUS

## 2020-01-16 MED ORDER — LABETALOL HCL 5 MG/ML IV SOLN: 10.0000 mg | INTRAVENOUS | Status: AC | PRN

## 2020-01-16 MED ORDER — PROSOURCE TF PO LIQD
45.0000 mL | Freq: Two times a day (BID) | ORAL | Status: DC
  Administered 2020-01-16: 45 mL
  Filled 2020-01-16 (×2): qty 45

## 2020-01-16 MED ORDER — LEVETIRACETAM IN NACL 1000 MG/100ML IV SOLN
1000.0000 mg | Freq: Once | INTRAVENOUS | Status: AC
  Administered 2020-01-16: 1000 mg via INTRAVENOUS
  Filled 2020-01-16: qty 100

## 2020-01-16 MED ORDER — POTASSIUM CHLORIDE 20 MEQ/15ML (10%) PO SOLN
20.0000 meq | Freq: Once | ORAL | Status: AC
  Administered 2020-01-16: 20 meq via ORAL
  Filled 2020-01-16: qty 15

## 2020-01-16 MED ORDER — TICAGRELOR 90 MG PO TABS
90.0000 mg | ORAL_TABLET | Freq: Two times a day (BID) | ORAL | Status: DC
  Administered 2020-01-16: 90 mg via ORAL
  Filled 2020-01-16: qty 1

## 2020-01-16 MED ORDER — ASPIRIN 81 MG PO CHEW
81.0000 mg | CHEWABLE_TABLET | Freq: Every day | ORAL | Status: DC
  Administered 2020-01-16 – 2020-01-17 (×2): 81 mg
  Filled 2020-01-16 (×2): qty 1

## 2020-01-16 MED ORDER — ACETAMINOPHEN 325 MG PO TABS: 650.0000 mg | ORAL_TABLET | ORAL | Status: DC | PRN

## 2020-01-16 MED ORDER — MIDAZOLAM 50MG/50ML (1MG/ML) PREMIX INFUSION
20.0000 mg/h | INTRAVENOUS | Status: DC
  Administered 2020-01-16: 5 mg/h via INTRAVENOUS
  Administered 2020-01-17: 15 mg/h via INTRAVENOUS
  Administered 2020-01-17 (×2): 20 mg/h via INTRAVENOUS
  Administered 2020-01-17: 12 mg/h via INTRAVENOUS
  Filled 2020-01-16 (×4): qty 50

## 2020-01-16 MED ORDER — ONDANSETRON HCL 4 MG/2ML IJ SOLN: 4.0000 mg | Freq: Four times a day (QID) | INTRAMUSCULAR | Status: DC | PRN

## 2020-01-16 MED ORDER — CHLORHEXIDINE GLUCONATE CLOTH 2 % EX PADS
6.0000 | MEDICATED_PAD | Freq: Every day | CUTANEOUS | Status: DC
  Administered 2020-01-16 – 2020-01-17 (×2): 6 via TOPICAL

## 2020-01-16 MED ORDER — LEVETIRACETAM IN NACL 1500 MG/100ML IV SOLN
1500.0000 mg | Freq: Two times a day (BID) | INTRAVENOUS | Status: DC
  Administered 2020-01-17: 1500 mg via INTRAVENOUS
  Filled 2020-01-16 (×3): qty 100

## 2020-01-16 MED ORDER — SODIUM CHLORIDE 0.9 % IV SOLN: 250.0000 mL | INTRAVENOUS | Status: DC | PRN

## 2020-01-16 MED ORDER — SODIUM CHLORIDE 0.9% FLUSH: 3.0000 mL | INTRAVENOUS | Status: DC | PRN

## 2020-01-16 MED ORDER — HYDRALAZINE HCL 20 MG/ML IJ SOLN: 10.0000 mg | INTRAMUSCULAR | Status: AC | PRN

## 2020-01-16 MED ORDER — VITAL HIGH PROTEIN PO LIQD
1000.0000 mL | ORAL | Status: DC
  Administered 2020-01-16: 1000 mL
  Filled 2020-01-16: qty 1000

## 2020-01-16 MED ORDER — TICAGRELOR 90 MG PO TABS
90.0000 mg | ORAL_TABLET | Freq: Two times a day (BID) | ORAL | Status: DC
  Administered 2020-01-16 – 2020-01-17 (×2): 90 mg
  Filled 2020-01-16 (×2): qty 1

## 2020-01-16 MED ORDER — LEVETIRACETAM IN NACL 1000 MG/100ML IV SOLN
1000.0000 mg | Freq: Two times a day (BID) | INTRAVENOUS | Status: DC
  Administered 2020-01-16: 1000 mg via INTRAVENOUS
  Filled 2020-01-16: qty 100

## 2020-01-16 MED ORDER — HEPARIN SODIUM (PORCINE) 5000 UNIT/ML IJ SOLN
5000.0000 [IU] | Freq: Three times a day (TID) | INTRAMUSCULAR | Status: DC
  Administered 2020-01-16 – 2020-01-17 (×4): 5000 [IU] via SUBCUTANEOUS
  Filled 2020-01-16 (×4): qty 1

## 2020-01-16 MED ORDER — MIDAZOLAM HCL 2 MG/2ML IJ SOLN
2.0000 mg | Freq: Once | INTRAMUSCULAR | Status: AC
  Administered 2020-01-16: 2 mg via INTRAVENOUS
  Filled 2020-01-16: qty 2

## 2020-01-16 MED FILL — Lidocaine HCl Local Preservative Free (PF) Inj 1%: INTRAMUSCULAR | Qty: 30 | Status: AC

## 2020-01-16 NOTE — Care Plan (Signed)
Family has decided to change code status to DNR. They wish to continue all aggressive care measures for now. Beside RN updated and order placed in chart.  Julian Hy, DO 01/16/20 12:27 PM [facility]

## 2020-01-16 NOTE — Consult Note (Addendum)
NEUROLOGY CONSULTATION NOTE   Date of service: January 16, 2020 Patient Name: Benjamin Montes MRN:  [id number] DOB:  1950/07/02 Reason for consult: "OOH Cardiac arrest, EEG with epileptiform discharges."  History of Present Illness  Kalon Erhardt is a 68 y.o. male with PMH significant for HTN, HLD, Prostate cancer who presented to [facility] on 10/2 with unreponsiveness x 15 mins and in Vifbb. 10 mins of ACLS with ROSC. Initial EKG demonstrated ST elevations, but normalized to be isoelectric.  He was emergently transferred to [facility] for left heart catheterization.  He remains unresponsive.  Patient unable to provide any meaningful hx due to unresponsiveness. Per notes, seems very functional at baseline and has been hiking for the past 2 weeks, had a fall on 10/1 with a negative CTH at an urgent care.  [place]  On 10/2 was negative. 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 No seizures were seen throughout the recording.   He was started on Keppra 1G Q12 hours.  He is on sedation with Versed, Propofol. Labs with elevated troponin, elevated lactic acid, CXR with increased patchy left lung airspace opacities are noted concerning for possible edema.   ROS   Unable to obtain detailed ROS 2/2 unresponsiveness.  Past History   Past Medical History:  Diagnosis Date  . Hyperlipidemia   . Hypertension   . Prostate cancer [facility])    History reviewed. No pertinent surgical history. History reviewed. No pertinent family history. Social History   Socioeconomic History  . Marital status: Married    Spouse name: Not on file  . Number of children: Not on file  . Years of education: Not on file  . Highest education level: Not on file  Occupational History  . Not on file  Tobacco Use  . Smoking status: Never Smoker  . Smokeless tobacco: Never Used   Substance and Sexual Activity  . Alcohol use: Not on file  . Drug use: Not on file  . Sexual activity: Not on file  Other Topics Concern  . Not on file  Social History Narrative  . Not on file   Social Determinants of Health   Financial Resource Strain:   . Difficulty of Paying Living Expenses: Not on file  Food Insecurity:   . Worried About Charity fundraiser in the Last Year: Not on file  . Ran Out of Food in the Last Year: Not on file  Transportation Needs:   . Lack of Transportation (Medical): Not on file  . Lack of Transportation (Non-Medical): Not on file  Physical Activity:   . Days of Exercise per Week: Not on file  . Minutes of Exercise per Session: Not on file  Stress:   . Feeling of Stress : Not on file  Social Connections:   . Frequency of Communication with Friends and Family: Not on file  . Frequency of Social Gatherings with Friends and Family: Not on file  . Attends Religious Services: Not on file  . Active Member of Clubs or Organizations: Not on file  . Attends Archivist Meetings: Not on file  . Marital Status: Not on file   No Known Allergies  Medications   Medications Prior to Admission  Medication Sig Dispense Refill Last Dose  . amLODipine (NORVASC) 5 MG tablet Take 5 mg by mouth daily.    01/14/2020  . Cholecalciferol (  VITAMIN D3) 1.25 MG (50000 UT) CAPS Take 1 tablet by mouth daily.   01/14/2020  . finasteride (PROSCAR) 5 MG tablet Take 5 mg by mouth daily.    01/14/2020  . latanoprost (XALATAN) 0.005 % ophthalmic solution Place 1 drop into both eyes daily.   01/14/2020  . pravastatin (PRAVACHOL) 40 MG tablet Take 40 mg by mouth daily.   01/14/2020     Vitals   Vitals:   01/16/20 1500 01/16/20 1600 01/16/20 1700 01/16/20 1800  BP: 96/64 98/65 103/64 (!) 83/55  Pulse: 63 70 73 67  Resp: (!) 22 (!) 25 (!) 23 (!) 21  Temp:      TempSrc:      SpO2: 99% 97% 97% 98%  Weight:      Height:         Body mass index is 25.74  kg/m.  Physical Exam   General: Laying comfortably in bed; HENT: Normal oropharynx and mucosa. Normal external appearance of ears and nose. Neck: Supple, no pain or tenderness CV: No JVD. No peripheral edema. Pulmonary: Symmetric Chest rise. On ventilator. Abdomen: Soft to touch, non-tender. Ext: No cyanosis, edema, or deformity Skin: No rash. Normal palpation of skin.  Musculoskeletal: Normal digits and nails by inspection. No clubbing.  Neurologic Examination on propofol and Versed.  Mental status/Cognition: Unresponsive. Does not open eyes to voice or loud clap or to noxious stimuli.  Noted to have rhythmic twitching of BL eyes every second with EEG correlate.  Brainstem reflexes: Pupils: 64mm BL and sluggish response to light Corneal: Somewhat difficult to assess due to the noted twitching of BL eyelids. Cough: intact Gag: I could not get far back into the oropharynx to elicit a gag.  Motor and senory: No movement to noxious stimuli in any extremities. No grimace noted to noxious stimuli.  Labs   CBC:  Recent Labs  Lab 02/02/2020 1523 02/02/2020 2003 01/16/20 0601 01/16/20 1149  WBC 9.3   < > 10.8* 15.9*  NEUTROABS 5.2  --   --   --   HGB 12.8*   < > 11.7* 13.7  HCT 38.7*   < > 35.5* 41.6  MCV 98.2   < > 96.5 96.5  PLT 132*   < > 152 205   < > = values in this interval not displayed.    Basic Metabolic Panel:  Lab Results  Component Value Date   NA 139 01/16/2020   K 3.5 01/16/2020   CO2 19 (L) 01/16/2020   GLUCOSE 156 (H) 01/16/2020   BUN 31 (H) 01/16/2020   CREATININE 1.48 (H) 01/16/2020   CALCIUM 8.8 (L) 01/16/2020   GFRNONAA 48 (L) 01/16/2020   GFRAA 55 (L) 01/16/2020   Lipid Panel: No results found for: LDLCALC HgbA1c: No results found for: HGBA1C Urine Drug Screen: No results found for: LABOPIA, COCAINSCRNUR, LABBENZ, AMPHETMU, THCU, LABBARB  Alcohol Level No results found for: [facility]   Imaging and Diagnostic studies  CT Head Wo  Contrast IMPRESSION: Negative head CT. No acute abnormality cervical spondylosis. Multilevel degenerative disc disease.  CT Cervical Spine Wo Contrast IMPRESSION: Negative head CT. No acute abnormality cervical spondylosis. Multilevel degenerative disc disease.  EEG LTVM - Continuous Bedside W/ Video Includes Portable EEG Read Description: EEG showed discontinuous pattern with brief 0.5-1 seconds of eeg suppression and bursts of highly epileptiform discharges. EEG was not reactive to noxious stimulation. Hyperventilation and photic stimulation were not performed.   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 No seizures were seen throughout the recording.  Impression   Shulem Mader is a 69 y.o. male with PMH significant for HTN, HLD, Prostate cancer who presented to [facility] on 10/2 with unreponsiveness x 15 mins and in Vifbb. 10 mins of ACLS with ROSC. He is on TTM for poor responsiveness 2/2 out of hospital arrest. Neuro exam on propofol and Versed with rhythmic twitching of both eyelids. Positive pupils, cough, unable to get to oropharynx to elicit a gag. Corneals are difficult to assess due to twtiching of eyelids.  Recommendations  - Gave an additional Keppra 1000mg  IV and increased Keppra to 1500mg  BID - Gave a Versed bolus of 4mg  IV once,  - Recommend continuing Versed between 0.1mg /Kg/hr to 2mg /kg/hr and uptitrate bsed on cEEG to burst suppression. - Maintain euvolemia, avoid hyponatremia, hypoglycemia, hypotension, hyperthermia. - MRI Brain without contrast when able - cEEG and seizure precautions. - I ordered Neuron Specific enolase  ______________________________________________________________________   Thank you for the opportunity to take part in the care of this patient. If you  have any further questions, please contact the neurology consultation attending.  Signed,  [place] Pager Number [phone]

## 2020-01-16 NOTE — Procedures (Addendum)
Patient Name: Benjamin Montes  MRN: [id number]  Epilepsy Attending: Lora Havens  Referring Physician/Provider: Dr Noemi Chapel Date: 01/16/2020 Duration: 24.51 mins  Patient history: 69yo m s/p cardiac arrest. EEG to evaluate for seizure.  Level of alertness:  comatose  AEDs during EEG study: LEV, Propofol  Technical aspects: This EEG study was done with scalp electrodes positioned according to the 10-20 International system of electrode placement. Electrical activity was acquired at a sampling rate of 500Hz  and reviewed with a high frequency filter of 70Hz  and a low frequency filter of 1Hz . EEG data were recorded continuously and digitally stored.   Description: EEG showed discontinuous pattern with brief 0.5-1 seconds of eeg suppression and bursts of highly epileptiform discharges. EEG was not reactive to noxious stimulation. Hyperventilation and photic stimulation were not performed.     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 No seizures were seen throughout the recording.  Dr Carlis Abbott was notified.  Benjamin Montes

## 2020-01-16 NOTE — Progress Notes (Signed)
EEG complete - results pending 

## 2020-01-16 NOTE — Progress Notes (Signed)
Progress Note  Patient Name: Benjamin Montes Date of Encounter: 01/16/2020  Primary Cardiologist: Sherren Mocha, MD  Subjective   Sedated on the ventilator.  Inpatient Medications    Scheduled Meds:  aspirin  81 mg Per Tube Daily   atorvastatin  40 mg Per Tube Daily   chlorhexidine gluconate (MEDLINE KIT)  15 mL Mouth Rinse BID   Chlorhexidine Gluconate Cloth  6 each Topical Daily   docusate  100 mg Per Tube BID   heparin  5,000 Units Subcutaneous Q8H   insulin aspart  1-3 Units Subcutaneous Q4H   mouth rinse  15 mL Mouth Rinse 10 times per day   pantoprazole (PROTONIX) IV  40 mg Intravenous Daily   polyethylene glycol  17 g Per Tube Daily   sodium chloride flush  3 mL Intravenous Q12H   ticagrelor  90 mg Oral BID   Continuous Infusions:  sodium chloride 10 mL/hr at 01/16/20 0700   sodium chloride     ampicillin-sulbactam (UNASYN) IV Stopped (01/16/20 0601)   levETIRAcetam     norepinephrine (LEVOPHED) Adult infusion     propofol (DIPRIVAN) infusion 35 mcg/kg/min (01/16/20 0700)   PRN Meds: sodium chloride, acetaminophen, fentaNYL (SUBLIMAZE) injection, fentaNYL (SUBLIMAZE) injection, ondansetron (ZOFRAN) IV, sodium chloride flush   Vital Signs    Vitals:   01/16/20 0615 01/16/20 0630 01/16/20 0645 01/16/20 0700  BP: 98/73 101/77 105/74 105/74  Pulse: (!) 53 (!) 55 (!) 55 (!) 54  Resp: $Remo'20 20 20 'GPVTF$ (!) 21  Temp:    (!) 96.4 F (35.8 C)  TempSrc:      SpO2: 100% 100% 100% 100%  Weight:      Height:        Intake/Output Summary (Last 24 hours) at 01/16/2020 0753 Last data filed at 01/16/2020 0700 Gross per 24 hour  Intake 1105.49 ml  Output 1080 ml  Net 25.49 ml   Filed Weights   02/05/2020 1544 01/16/20 0500  Weight: 88.5 kg 86.1 kg    Telemetry    Sinus rhythm this morning.  Personally reviewed.  ECG    An ECG dated 01/16/2020 was personally reviewed today and demonstrated:  Sinus bradycardia with diffuse nonspecific ST-T  abnormalities.  Physical Exam   GEN:  Facial ecchymoses.  Cervical collar in place. Neck:  Unable to assess carotids. Cardiac: RRR, no murmur or gallop.  Respiratory: Nonlabored. Clear to auscultation bilaterally. GI: Soft, nontender, bowel sounds present. MS:  Mild lower leg edema; No deformity. Neuro:   Sedated.  Labs    Chemistry Recent Labs  Lab 01/22/2020 1523 01/28/2020 2003 01/16/20 0601  NA 139 138   139 138  K 3.3* 3.5   3.6 3.9  CL 106 103 106  CO2 24 21* 19*  GLUCOSE 155* 255* 122*  BUN 34* 36* 33*  CREATININE 1.35* 1.77* 1.51*  CALCIUM 8.6* 8.6* 8.7*  PROT 6.1*  --  5.4*  ALBUMIN 3.5  --  3.1*  AST 170*  --  157*  ALT 139*  --  131*  ALKPHOS 45  --  37*  BILITOT 0.5  --  1.1  GFRNONAA 53* 38* 46*  GFRAA >60 44* 54*  ANIONGAP $RemoveB'9 14 13     'GCfmXWKm$ Hematology Recent Labs  Lab 02/05/2020 1523 01/19/2020 1523 01/22/2020 2003 01/28/2020 2005 01/16/20 0601  WBC 9.3  --   --  12.3* 10.8*  RBC 3.94*  --   --  3.86* 3.68*  HGB 12.8*   < > 12.2* 12.1* 11.7*  HCT 38.7*   < > 36.0* 37.0* 35.5*  MCV 98.2  --   --  95.9 96.5  MCH 32.5  --   --  31.3 31.8  MCHC 33.1  --   --  32.7 33.0  RDW 13.8  --   --  13.9 13.9  PLT 132*  --   --  199 152   < > = values in this interval not displayed.    Cardiac Enzymes Recent Labs  Lab 01/27/2020 1523  TROPONINIHS 330*    Radiology    CT Head Wo Contrast  Result Date: 01/16/2020 CLINICAL DATA:  Status post fall with a blow to the head while hiking yesterday. The patient became unresponsive today. Initial encounter. EXAM: CT HEAD WITHOUT CONTRAST CT CERVICAL SPINE WITHOUT CONTRAST TECHNIQUE: Multidetector CT imaging of the head and cervical spine was performed following the standard protocol without intravenous contrast. Multiplanar CT image reconstructions of the cervical spine were also generated. COMPARISON:  None. FINDINGS: CT HEAD FINDINGS Brain: No evidence of acute infarction, hemorrhage, hydrocephalus, extra-axial collection or  mass lesion/mass effect. Vascular: No hyperdense vessel or unexpected calcification. Skull: Normal. Negative for fracture or focal lesion. Sinuses/Orbits: Negative. Other: Endotracheal tube and OG tube noted. CT CERVICAL SPINE FINDINGS Alignment: Maintained. Skull base and vertebrae: No acute fracture. No primary bone lesion or focal pathologic process. Soft tissues and spinal canal: No prevertebral fluid or swelling. No visible canal hematoma. Disc levels:  Loss of disc space height is seen from C3-C7. Upper chest: Lung apices clear. Other: None. IMPRESSION: Negative head CT. No acute abnormality cervical spondylosis. Multilevel degenerative disc disease. Electronically Signed   By: Drusilla Kanner M.D.   On: 01/25/2020 15:51   CT Cervical Spine Wo Contrast  Result Date: 01/22/2020 CLINICAL DATA:  Status post fall with a blow to the head while hiking yesterday. The patient became unresponsive today. Initial encounter. EXAM: CT HEAD WITHOUT CONTRAST CT CERVICAL SPINE WITHOUT CONTRAST TECHNIQUE: Multidetector CT imaging of the head and cervical spine was performed following the standard protocol without intravenous contrast. Multiplanar CT image reconstructions of the cervical spine were also generated. COMPARISON:  None. FINDINGS: CT HEAD FINDINGS Brain: No evidence of acute infarction, hemorrhage, hydrocephalus, extra-axial collection or mass lesion/mass effect. Vascular: No hyperdense vessel or unexpected calcification. Skull: Normal. Negative for fracture or focal lesion. Sinuses/Orbits: Negative. Other: Endotracheal tube and OG tube noted. CT CERVICAL SPINE FINDINGS Alignment: Maintained. Skull base and vertebrae: No acute fracture. No primary bone lesion or focal pathologic process. Soft tissues and spinal canal: No prevertebral fluid or swelling. No visible canal hematoma. Disc levels:  Loss of disc space height is seen from C3-C7. Upper chest: Lung apices clear. Other: None. IMPRESSION: Negative head CT.  No acute abnormality cervical spondylosis. Multilevel degenerative disc disease. Electronically Signed   By: Drusilla Kanner M.D.   On: 01/31/2020 15:51   CARDIAC CATHETERIZATION  Result Date: 01/27/2020  Ost LAD to Prox LAD lesion is 100% stenosed.  A drug-eluting stent was successfully placed using a STENT RESOLUTE ONYX 4.0X15.  Post intervention, there is a 0% residual stenosis.  Ost Cx to Prox Cx lesion is 30% stenosed.  There is severe left ventricular systolic dysfunction.  LV end diastolic pressure is severely elevated.  The left ventricular ejection fraction is 25-35% by visual estimate.  1.  Acute anterior wall MI secondary to occlusion of the ostial/proximal LAD, treated successfully with PCI using a 4.0 x 15 mm resolute Onyx DES 2.  Nonobstructive  left main, left circumflex, and RCA stenosis 3.  Severe segmental LV systolic dysfunction with LVEF estimated at approximately 30%, with severely elevated LVEDP: Recommendations:  Tirofiban x12 hours  Ticagrelor 180 mg per tube now, then 90 mg twice daily.  Dual antiplatelet therapy with aspirin and ticagrelor 12 months without interruption  Therapeutic hypothermia/ventilator management per CCM team, appreciate their care  Post MI medical therapy as tolerated  Friends/family updated post procedure   DG CHEST PORT 1 VIEW  Result Date: 01/14/2020 CLINICAL DATA:  Cardiac arrest EXAM: PORTABLE CHEST 1 VIEW COMPARISON:  Chest x-ray 01/30/2020 3:36 p.m. FINDINGS: Enteric tube with tip terminating 4 cm above the carina. Enteric tube coursing below diaphragm with tip and side port overlying the expected region of the gastric lumen. The heart size and mediastinal contours are within normal limits. No focal consolidation. Slightly increased interstitial markings. No pleural effusion. No pneumothorax. No acute osseous abnormality. IMPRESSION: 1. Mild pulmonary edema. 2. Lines and tubes in stable position. Electronically Signed   By: Iven Finn  M.D.   On: 01/16/2020 20:26   DG Chest Port 1 View  Result Date: 01/29/2020 CLINICAL DATA:  Tube placement EXAM: PORTABLE CHEST 1 VIEW COMPARISON:  None. FINDINGS: Endotracheal tube is positioned with tip below the thoracic inlet. Esophagogastric tube is position with tip and side port below the diaphragm. Mild cardiomegaly. No acute abnormality of the lungs. IMPRESSION: 1. Endotracheal tube is positioned with tip below the thoracic inlet. 2. Esophagogastric tube is positioned with tip and side port below the diaphragm. 3. No acute abnormality of the lungs in AP portable projection. Electronically Signed   By: Eddie Candle M.D.   On: 02/11/2020 15:58    Patient Profile     69 y.o. male with a history of hypertension, hyperlipidemia, and prostate cancer now status post cardiac arrest.  Assessment & Plan    1.  Ventricular fibrillation cardiac arrest, out of hospital.  Occurred while passenger in an automobile.  LVEF estimated at approximately 30% at angiography, echocardiogram pending.  Currently in sinus rhythm.  He is sedated on ventilator and undergoing cooling protocol per CCM.  2.  Acute anterior infarct with occlusion of the ostial/proximal LAD managed with DES on October 2.  Otherwise nonobstructive CAD.  Currently on aspirin, Brilinta, and Lipitor.  3.  Essential hypertension based on outpatient medications including Norvasc and lisinopril.  4.  Mixed hyperlipidemia based on outpatient medications including Pravachol.  Transitioned to Lipitor with ACS.  Continue supportive measures, appreciate assistance from CCM.  Hopefully will be able to wean and reassess neurological status in the next 24-48 hours.  He is hemodynamically stable at this time currently not on beta-blocker or ARB with low to low normal blood pressure and heart rate in the 60s in sinus rhythm.  Follow-up echocardiogram is pending.  Also check baseline lipids.  Continue aspirin, Brilinta, and Lipitor.  Signed, Rozann Lesches, MD  01/16/2020, 7:53 AM

## 2020-01-16 NOTE — Progress Notes (Addendum)
Initial Nutrition Assessment  DOCUMENTATION CODES:   Not applicable  INTERVENTION:   Initiate tube feeding via OG tube: Vital High Protein at 60 ml/h (1440 ml per day)  Provides 1440 kcal (1931 kcal total with propofol), 126 gm protein, 1204 ml free water daily  NUTRITION DIAGNOSIS:   Inadequate oral intake related to inability to eat as evidenced by NPO status.  GOAL:   Patient will meet greater than or equal to 90% of their needs  MONITOR:   Vent status, TF tolerance, Labs  REASON FOR ASSESSMENT:   Consult Enteral/tube feeding initiation and management  ASSESSMENT:   69 yo male who tripped and hit his head while hiking on 10/1. Negative head CT at urgent care. Became unresponsive 10/2 and was in V fib when he arrived at [facility] on 10/2. Transferred to [facility] for cardiac cath, S/P placement of stent to LAD. PMH includes HTN, HLD.   Received MD Consult for TF initiation and management. OG tube in place.  Currently on 36 degree TTM. S/P code blue this morning.  Patient is now a DNR, but family wishes to continue all aggressive measures for now.   Patient is currently intubated on ventilator support MV: 12 L/min Temp (24hrs), Avg:96.4 F (35.8 C), Min:93.9 F (34.4 C), Max:97.9 F (36.6 C) MAP range >/= 68 so far today Propofol: 18.6 ml/hr providing 491 kcal from lipid  Labs reviewed. Mag 2.5 CBG: 98-128  Medications reviewed and include propofol, Keppra, KCl, novoLOG.    NUTRITION - FOCUSED PHYSICAL EXAM:  unable to complete, RD working remotely  Diet Order:   Diet Order            Diet NPO time specified  Diet effective now                 EDUCATION NEEDS:   No education needs have been identified at this time  Skin:  Skin Assessment: Reviewed RN Assessment (L eye abrasion)  Last BM:  no BM documented  Height:   Ht Readings from Last 1 Encounters:  01/21/2020 6' (1.829 m)    Weight:   Wt Readings from Last 1 Encounters:  01/16/20 86.1 kg     Ideal Body Weight:  80.9 kg  BMI:  Body mass index is 25.74 kg/m.  Estimated Nutritional Needs:   Kcal:  1950  Protein:  125-145 gm  Fluid:  >/= 2 L    Lucas Mallow, RD, LDN, CNSC Please refer to Amion for contact information.

## 2020-01-16 NOTE — Care Plan (Addendum)
EEG with frequent spikes Keppra increased to 1 g Q12h and versed 2mg  IV now and infusion added 5mg  per hour  Appreciate Neuro's assistance.  Julian Hy, DO 01/16/20 5:14 PM [facility]

## 2020-01-16 NOTE — Progress Notes (Signed)
LTM EEG hooked up and running - no initial skin breakdown - push button tested - neuro notified.  

## 2020-01-16 NOTE — Progress Notes (Addendum)
Chaplain responded to Code Blue.  Provided hospitality, emotional support to pt's wife following increasingly bad news.  Son Benjamin Montes, like pt), daughter Benjamin Montes) and daughter's husband Benjamin Montes) arrived from hotel - everyone had flown in last night.  Chaplain is available for ongoing spiritual support as needed.    Benjamin Montes [phone]   01/16/20 1200  Clinical Encounter Type  Visited With Patient and family together  Visit Type Code;Critical Care  Referral From Care management  Consult/Referral To Chaplain  Spiritual Encounters  Spiritual Needs Emotional  Stress Factors  Family Stress Factors Loss of control;Major life changes

## 2020-01-16 NOTE — Plan of Care (Signed)
Versed increased to 15mg /h based on neuro's recommendation based on cEEG results.  Julian Hy, DO 01/16/20 8:40 PM [facility]

## 2020-01-16 NOTE — Progress Notes (Signed)
°   01/19/2020 2241  Clinical Encounter Type  Visited With Family  Visit Type Initial  Referral From Nurse  Consult/Referral To Chaplain  Spiritual Encounters  Spiritual Needs Ritual (Annointing of the sick)   Chaplain responded to page from Pt's nurse, [place]. Pt's family requested a Catholic priest for Anointing of the Sick for the Pt. Chaplain reached out to Msgr Marcaccio at [employer] to confirm that he was allowed to visit. Msgr plans on coming Sunday morning in between his liturgies. Chaplain also provided emotional support to Pt's wife. Chaplain remains available as needed.  This note was prepared by Chaplain Resident, Dante Gang, MDiv. Chaplain remains available as needed through the on-call pager: [phone].

## 2020-01-16 NOTE — Code Documentation (Signed)
[place]orsades [place] points on tele, pulseless. Chest compressions and ACLS resuscitation initiated. DCCV x 1, epi x 1, Mg+ 2g given. ROSC achieved. Labs, follow up CXR ordered. Now requiring 100% FiO2, bilateral breath sounds. Ongoing myoclonus- back on propofol now.  Wife present initially during code. Chaplain with wife. Adult children came to the hospital emergently and updated at bedside Family will decide if they wish to continue full code status. They understand my concern for a good neurologic outcome with myoclonus and repeat cardiac arrests.  Julian Hy, DO 01/16/20 12:03 PM [facility]

## 2020-01-16 NOTE — Progress Notes (Signed)
NAME:  Benjamin Montes, MRN:  [id number], DOB:  [birth year], LOS: 1 ADMISSION DATE:  01/16/2020, CONSULTATION DATE:  10/2 REFERRING MD:  Benjamin Montes, CHIEF COMPLAINT:  VF arrest   Brief History   Benjamin Montes yesterday-  tripped and hit his head, negative head CT at [facility]. Unresponsive today in the car coming home, 15 min later arrived at [facility] in VF. LHC with DES to LAD.  History of present illness   Benjamin Montes is a 69 y/o gentleman from [place] with a history of hypertension who presented to the ED at [facility] on 10/2 after being unresponsive 15 minutes prior in the car with his friends.  He had a fall yesterday while hiking the [place], but was seen at urgent care and had a negative head CT scan.  When he arrived he was found to be in pulseless in ventricular fibrillation; ACLS was initiated.  ROSC was achieved after about 10 minutes. Initial EKG demonstrated ST elevations, but normalized to be isoelectric.  He was emergently transferred to [facility] for left heart catheterization.  He remains nonresponsive.   Per his friend Benjamin Montes, he had been hiking for the past 2 weeks in the [place] and fell yesterday but never passed out. He continued to hike 2 more miles after his fall and went to the ED for evaluation last night due to facial injuries. He had 4 sutures to his lip and a negative head CT. He felt well overnight and this morning. He had sudden loss of consciousness while in the car and suddenly foaming at the mouth. He has not complained of dizziness, vision changes, chest pain, or other abnormal symptoms recently. He has a history of HTN and low-grade prostate cancer, which is treated medically. He is not on [facility]. No previous history of heart disease. He did not have a significant chest injury from his fall yesterday. No history of DM. His wife is flying to [employer] from [place].  Benjamin Montes; wife [phone]   Past Medical History  Minimal medical records  available Hypertension Hyperlipidemia BPH  Significant Montes Events     Consults:  Cardiology PCCM  Procedures:  ETT 10/2 [facility] 10/2  Significant Diagnostic Tests:  LHC 10/2>> DES to LAD  Micro Data:    Antimicrobials:  Ampicillin-sulbactam 10/2>   Interim history/subjective:  Torsades overnight- ROSC after brief CPR. Myoclonus overnight- starter propofol. EEG not yet completed.  Objective   Blood pressure 105/74, pulse (!) 54, temperature (!) 96.4 F (35.8 C), resp. rate (!) 21, height 6' (1.829 m), weight 86.1 kg, SpO2 100 %.    Vent Mode: PRVC FiO2 (%):  [50 %-100 %] 50 % Set Rate:  [18 bmp-20 bmp] 20 bmp Vt Set:  [500 mL-620 mL] 620 mL PEEP:  [5 cmH20] 5 cmH20 Plateau Pressure:  [11 cmH20-15 cmH20] 15 cmH20   Intake/Output Summary (Last 24 hours) at 01/16/2020 0758 Last data filed at 01/16/2020 0700 Gross per 24 hour  Intake 1105.49 ml  Output 1080 ml  Net 25.49 ml   Filed Weights   01/18/2020 1544 01/16/20 0500  Weight: 88.5 kg 86.1 kg    Examination: General: critically ill appearing man laying in bed in NAD, sedated on propofol HENT: facial injuries with bruising from previous fall, eyes anicteric Lungs: breathing above the vent, synchronous, CTAB. Thin bloody secretions from ETT Cardiovascular: Bradycardic, sinus rhythm on telemetry.  No murmurs Abdomen: Thin, soft, nondistended Extremities: minimal ankle edema Neuro: frequent eye blinking, + pupils. Unable  to assess corneal given ongoing blinking frequently.  No response to verbal stimulation, no response to nailbed pressure in any extremity.  No gag or cough reflex. Derm: no rashes, sutures in lip  Resolved Montes Problem list     Assessment & Plan:  VF cardiac arrest due to STEMI; prolonged QTc with torsades overnight -TTM protocol to 36 degrees to complete 24 hours, then rewarming per protocol -Neuroprotective measures-maintain normoxia, normocapnia, head of bed greater than 30 degrees,  optimize electrolytes -Telemetry monitoring -Optimize electrolytes-additional potassium today.  Magnesium within normal limits. -Amiodarone bolus and infusion completed.  May be contributing to prolonged QTC -DAPT  -Minimize sedation to facilitate neuro prognostication-continue propofol to help with myoclonus for now. -Optimize electrolytes-goal potassium 4-5 and magnesium greater than 2 -Echocardiogram pending  Acute hypoxic respiratory failure due to cardiac arrest -Continue low tidal volume ventilation, 4 to 8 cc/kg ideal body weight with goal plateau less than 30 driving pressure less than 15. -Daily SAT and SBT as tolerated.  Mental status and hemodynamic instability currently preclude extubation -VAP prevention protocol -Empiric Unasyn given concern for likely aspiration during resuscitation  HLD -continue atorvastatin -We will defer getting lipids at this point given propofol use  H/o HTN; currently controlled -Holding PTA antihypertensives  Lactic acidosis due to hypoperfusion during cardiac arrest-improving  Chronic anemia -Iron studies as an outpatient -Transfuse for hemoglobin less than 7 or hemodynamically significant bleeding. -Continue monitoring  AKI versus CKD 3A -Continue to monitor renal function -Maintain adequate renal perfusion -Renally dose medications and avoid nephrotoxic meds -Strict I/O -Goal of euvolemia.  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  Wife will be coming back today.   Best practice:  Diet: TF Pain/Anxiety/Delirium protocol (if indicated): fentanyl PRN VAP protocol (if indicated): yes DVT prophylaxis: heparin Rives GI prophylaxis: pantoprazole Glucose  control: SSI Mobility: bedrest Code Status: full Family Communication: wife will be updated at bedside when she arrives. Disposition: ICU  Labs   CBC: Recent Labs  Lab 02/12/2020 1523 01/19/2020 2003 01/26/2020 2005 01/16/20 0601  WBC 9.3  --  12.3* 10.8*  NEUTROABS 5.2  --   --   --   HGB 12.8* 12.2* 12.1* 11.7*  HCT 38.7* 36.0* 37.0* 35.5*  MCV 98.2  --  95.9 96.5  PLT 132*  --  199 176    Basic Metabolic Panel: Recent Labs  Lab 01/30/2020 1523 01/18/2020 2003 01/24/2020 2058 01/16/20 0601  NA 139 138  139  --  138  K 3.3* 3.5  3.6  --  3.9  CL 106 103  --  106  CO2 24 21*  --  19*  GLUCOSE 155* 255*  --  122*  BUN 34* 36*  --  33*  CREATININE 1.35* 1.77*  --  1.51*  CALCIUM 8.6* 8.6*  --  8.7*  MG  --   --  1.9 2.1  PHOS  --   --   --  3.1   GFR: Estimated Creatinine Clearance: 50.7 mL/min (A) (by C-G formula based on SCr of 1.51 mg/dL (H)). Recent Labs  Lab 01/25/2020 1523 01/14/2020 1531 01/28/2020 2005 02/04/2020 2025 01/16/20 0601  WBC 9.3  --  12.3*  --  10.8*  LATICACIDVEN  --  3.7*  --  2.8*  --  Liver Function Tests: Recent Labs  Lab 01/23/2020 1523 01/16/20 0601  AST 170* 157*  ALT 139* 131*  ALKPHOS 45 37*  BILITOT 0.5 1.1  PROT 6.1* 5.4*  ALBUMIN 3.5 3.1*   No results for input(s): LIPASE, AMYLASE in the last 168 hours. No results for input(s): AMMONIA in the last 168 hours.  ABG    Component Value Date/Time   HCO3 22.8 01/29/2020 2003   TCO2 24 02/10/2020 2003   ACIDBASEDEF 3.0 (H) 02/01/2020 2003   O2SAT 77.0 01/16/2020 2003     Coagulation Profile: Recent Labs  Lab 02/05/2020 1531 01/21/2020 2003  INR 1.1 1.2    Cardiac Enzymes: No results for input(s): CKTOTAL, CKMB, CKMBINDEX, TROPONINI in the last 168 hours.  HbA1C: No results found for: HGBA1C  CBG: Recent Labs  Lab 02/02/2020 1518 01/24/2020 2001 02/08/2020 2331 01/16/20 0447 01/16/20 0736  GLUCAP 147* 238* 223* 90 98    This patient is critically ill with multiple organ  system failure which requires frequent high complexity decision making, assessment, support, evaluation, and titration of therapies. This was completed through the application of advanced monitoring technologies and extensive interpretation of multiple databases. During this encounter critical care time was devoted to patient care services described in this note for 39 minutes.  Julian Hy, DO 01/16/20 8:15 AM [facility]

## 2020-01-16 NOTE — Progress Notes (Signed)
  Echocardiogram 2D Echocardiogram has been performed.  Benjamin Montes 01/16/2020, 10:55 AM

## 2020-01-17 ENCOUNTER — Encounter (HOSPITAL_COMMUNITY): Payer: Self-pay | Admitting: Cardiovascular Disease

## 2020-01-17 DIAGNOSIS — G40901 Epilepsy, unspecified, not intractable, with status epilepticus: Secondary | ICD-10-CM | POA: Diagnosis not present

## 2020-01-17 DIAGNOSIS — I469 Cardiac arrest, cause unspecified: Secondary | ICD-10-CM | POA: Diagnosis not present

## 2020-01-17 DIAGNOSIS — I5021 Acute systolic (congestive) heart failure: Secondary | ICD-10-CM

## 2020-01-17 LAB — CBC
HCT: 34.9 % — ABNORMAL LOW (ref 39.0–52.0)
Hemoglobin: 11.7 g/dL — ABNORMAL LOW (ref 13.0–17.0)
MCH: 33.2 pg (ref 26.0–34.0)
MCHC: 33.5 g/dL (ref 30.0–36.0)
MCV: 99.1 fL (ref 80.0–100.0)
Platelets: 161 10*3/uL (ref 150–400)
RBC: 3.52 MIL/uL — ABNORMAL LOW (ref 4.22–5.81)
RDW: 14.6 % (ref 11.5–15.5)
WBC: 12.6 10*3/uL — ABNORMAL HIGH (ref 4.0–10.5)
nRBC: 0 % (ref 0.0–0.2)

## 2020-01-17 LAB — POCT I-STAT 7, (LYTES, BLD GAS, ICA,H+H)
Acid-base deficit: 1 mmol/L (ref 0.0–2.0)
Acid-base deficit: 1 mmol/L (ref 0.0–2.0)
Bicarbonate: 23.8 mmol/L (ref 20.0–28.0)
Bicarbonate: 23.9 mmol/L (ref 20.0–28.0)
Calcium, Ion: 1.13 mmol/L — ABNORMAL LOW (ref 1.15–1.40)
Calcium, Ion: 1.13 mmol/L — ABNORMAL LOW (ref 1.15–1.40)
HCT: 33 % — ABNORMAL LOW (ref 39.0–52.0)
HCT: 34 % — ABNORMAL LOW (ref 39.0–52.0)
Hemoglobin: 11.2 g/dL — ABNORMAL LOW (ref 13.0–17.0)
Hemoglobin: 11.6 g/dL — ABNORMAL LOW (ref 13.0–17.0)
O2 Saturation: 100 %
O2 Saturation: 100 %
Potassium: 3.5 mmol/L (ref 3.5–5.1)
Potassium: 3.5 mmol/L (ref 3.5–5.1)
Sodium: 138 mmol/L (ref 135–145)
Sodium: 139 mmol/L (ref 135–145)
TCO2: 25 mmol/L (ref 22–32)
TCO2: 25 mmol/L (ref 22–32)
pCO2 arterial: 38.1 mmHg (ref 32.0–48.0)
pCO2 arterial: 38.7 mmHg (ref 32.0–48.0)
pH, Arterial: 7.398 (ref 7.350–7.450)
pH, Arterial: 7.405 (ref 7.350–7.450)
pO2, Arterial: 540 mmHg — ABNORMAL HIGH (ref 83.0–108.0)
pO2, Arterial: 557 mmHg — ABNORMAL HIGH (ref 83.0–108.0)

## 2020-01-17 LAB — LIPID PANEL
Cholesterol: 128 mg/dL (ref 0–200)
Cholesterol: 129 mg/dL (ref 0–200)
HDL: 52 mg/dL (ref >40)
HDL: 53 mg/dL (ref >40)
LDL Cholesterol: 60 mg/dL (ref 0–99)
LDL Cholesterol: 61 mg/dL (ref 0–99)
Total CHOL/HDL Ratio: 2.4 RATIO
Total CHOL/HDL Ratio: 2.5 RATIO
Triglycerides: 77 mg/dL (ref <150)
Triglycerides: 78 mg/dL (ref <150)
VLDL: 15 mg/dL (ref 0–40)
VLDL: 16 mg/dL (ref 0–40)

## 2020-01-17 LAB — GLUCOSE, CAPILLARY
Glucose-Capillary: 100 mg/dL — ABNORMAL HIGH (ref 70–99)
Glucose-Capillary: 115 mg/dL — ABNORMAL HIGH (ref 70–99)
Glucose-Capillary: 120 mg/dL — ABNORMAL HIGH (ref 70–99)
Glucose-Capillary: 125 mg/dL — ABNORMAL HIGH (ref 70–99)
Glucose-Capillary: 126 mg/dL — ABNORMAL HIGH (ref 70–99)

## 2020-01-17 LAB — COMPREHENSIVE METABOLIC PANEL
ALT: 93 U/L — ABNORMAL HIGH (ref 0–44)
AST: 102 U/L — ABNORMAL HIGH (ref 15–41)
Albumin: 2.5 g/dL — ABNORMAL LOW (ref 3.5–5.0)
Alkaline Phosphatase: 40 U/L (ref 38–126)
Anion gap: 6 (ref 5–15)
BUN: 34 mg/dL — ABNORMAL HIGH (ref 8–23)
CO2: 29 mmol/L (ref 22–32)
Calcium: 8.3 mg/dL — ABNORMAL LOW (ref 8.9–10.3)
Chloride: 108 mmol/L (ref 98–111)
Creatinine, Ser: 1.35 mg/dL — ABNORMAL HIGH (ref 0.61–1.24)
GFR calc Af Amer: >60 mL/min (ref >60)
GFR calc non Af Amer: 53 mL/min — ABNORMAL LOW (ref >60)
Glucose, Bld: 105 mg/dL — ABNORMAL HIGH (ref 70–99)
Potassium: 3.7 mmol/L (ref 3.5–5.1)
Sodium: 143 mmol/L (ref 135–145)
Total Bilirubin: 1 mg/dL (ref 0.3–1.2)
Total Protein: 4.8 g/dL — ABNORMAL LOW (ref 6.5–8.1)

## 2020-01-17 LAB — POCT ACTIVATED CLOTTING TIME
Activated Clotting Time: 180 seconds
Activated Clotting Time: 257 seconds

## 2020-01-17 LAB — MAGNESIUM: Magnesium: 2.3 mg/dL (ref 1.7–2.4)

## 2020-01-17 LAB — PHOSPHORUS: Phosphorus: 3.6 mg/dL (ref 2.5–4.6)

## 2020-01-17 LAB — HEMOGLOBIN A1C
Hgb A1c MFr Bld: 5.7 % — ABNORMAL HIGH (ref 4.8–5.6)
Mean Plasma Glucose: 116.89 mg/dL

## 2020-01-17 LAB — TRIGLYCERIDES: Triglycerides: 78 mg/dL (ref <150)

## 2020-01-17 MED ORDER — POTASSIUM CHLORIDE 20 MEQ PO PACK
40.0000 meq | PACK | Freq: Once | ORAL | Status: AC
  Administered 2020-01-17: 40 meq
  Filled 2020-01-17: qty 2

## 2020-01-17 MED ORDER — NOREPINEPHRINE 4 MG/250ML-% IV SOLN
2.0000 ug/min | INTRAVENOUS | Status: DC
  Administered 2020-01-17: 8 ug/min via INTRAVENOUS
  Administered 2020-01-17: 2 ug/min via INTRAVENOUS
  Filled 2020-01-17 (×2): qty 250

## 2020-01-17 MED ORDER — GLYCOPYRROLATE 0.2 MG/ML IJ SOLN
0.2000 mg | INTRAMUSCULAR | Status: DC | PRN
  Filled 2020-01-17: qty 1

## 2020-01-17 MED ORDER — FENTANYL 2500MCG IN NS 250ML (10MCG/ML) PREMIX INFUSION
0.0000 ug/h | INTRAVENOUS | Status: DC
  Administered 2020-01-17: 200 ug/h via INTRAVENOUS
  Filled 2020-01-17: qty 250

## 2020-01-17 MED ORDER — FENTANYL CITRATE (PF) 100 MCG/2ML IJ SOLN: 50.0000 ug | INTRAMUSCULAR | Status: DC | PRN

## 2020-01-17 MED ORDER — POLYVINYL ALCOHOL 1.4 % OP SOLN
2.0000 [drp] | OPHTHALMIC | Status: DC | PRN
  Administered 2020-01-17 (×2): 2 [drp] via OPHTHALMIC
  Filled 2020-01-17: qty 15

## 2020-01-17 MED ORDER — GLYCOPYRROLATE 1 MG PO TABS
1.0000 mg | ORAL_TABLET | ORAL | Status: DC | PRN
  Filled 2020-01-17: qty 1

## 2020-01-17 MED ORDER — GLYCOPYRROLATE 0.2 MG/ML IJ SOLN: 0.2000 mg | INTRAMUSCULAR | Status: DC | PRN

## 2020-01-17 MED ORDER — FENTANYL BOLUS VIA INFUSION
100.0000 ug | INTRAVENOUS | Status: DC | PRN
  Filled 2020-01-17: qty 100

## 2020-01-17 MED ORDER — SODIUM CHLORIDE 0.9 % IV SOLN: 250.0000 mL | INTRAVENOUS | Status: DC

## 2020-01-17 MED ORDER — POLYVINYL ALCOHOL 1.4 % OP SOLN
1.0000 [drp] | Freq: Four times a day (QID) | OPHTHALMIC | Status: DC | PRN
  Filled 2020-01-17: qty 15

## 2020-01-17 MED ORDER — DIPHENHYDRAMINE HCL 50 MG/ML IJ SOLN: 25.0000 mg | INTRAMUSCULAR | Status: DC | PRN

## 2020-01-17 MED FILL — Medication: Qty: 1 | Status: AC

## 2020-01-18 MED FILL — Medication: Qty: 1 | Status: AC

## 2020-02-14 NOTE — Progress Notes (Signed)
[facility] (CDS) referral [phone]

## 2020-02-14 NOTE — Plan of Care (Signed)
Spoke to the family regarding their goals of care. They wish to withdraw life support due to concerns of his ability to have a meaningful recovery. [facility] has contacted them and has given the family multiple questions regarding what organ donation would mean and the timeline of these events. The family is waiting on the coordindator to determine if he is a candidate. Informed by coordinator that he is not. Will discuss timing of withdrawing with his family.  Julian Hy, DO 25-Jan-2020 4:29 PM [facility]

## 2020-02-14 NOTE — Progress Notes (Signed)
Patient pronounced at 1821 by Beckie Salts, RN and Jill Side, RN.  Family at bedside

## 2020-02-14 NOTE — Progress Notes (Signed)
NAME:  Benjamin Montes, MRN:  [id number], DOB:  July 04, 1950, LOS: 2 ADMISSION DATE:  02/01/2020, CONSULTATION DATE:  10/2 REFERRING MD:  Burt Knack, CHIEF COMPLAINT:  VF arrest   Brief History   [place] yesterday-  tripped and hit his head, negative head CT at [facility]. Unresponsive today in the car coming home, 15 min later arrived at [facility] in VF. LHC with DES to LAD.  History of present illness   Benjamin Montes is a 69 y/o gentleman from [place] with a history of hypertension who presented to the ED at [facility] on 10/2 after being unresponsive 15 minutes prior in the car with his friends.  He had a fall yesterday while hiking [place], but was seen at urgent care and had a negative head CT scan.  When he arrived he was found to be in pulseless in ventricular fibrillation; ACLS was initiated.  ROSC was achieved after about 10 minutes. Initial EKG demonstrated ST elevations, but normalized to be isoelectric.  He was emergently transferred to [facility] for left heart catheterization.  He remains nonresponsive.   Per his friend Benjamin Montes, he had been hiking for the past 2 weeks in the [place] and fell yesterday but never passed out. He continued to hike 2 more miles after his fall and went to the ED for evaluation last night due to facial injuries. He had 4 sutures to his lip and a negative head CT. He felt well overnight and this morning. He had sudden loss of consciousness while in the car and suddenly foaming at the mouth. He has not complained of dizziness, vision changes, chest pain, or other abnormal symptoms recently. He has a history of HTN and low-grade prostate cancer, which is treated medically. He is not on [facility]. No previous history of heart disease. He did not have a significant chest injury from his fall yesterday. No history of DM. His wife is flying to [employer] from [place].  Benjamin Montes; wife [phone]   Past Medical History  Minimal medical records  available Hypertension Hyperlipidemia BPH  Significant Hospital Events     Consults:  Cardiology PCCM  Procedures:  ETT 10/2 [facility] 10/2  Significant Diagnostic Tests:  LHC 10/2>> DES to LAD Echo 10/3> LVEF 30-35%, G2DD, moderate LVH  Micro Data:    Antimicrobials:  Ampicillin-sulbactam 10/2>   Interim history/subjective:  Overnight needed increased midazolam due to concerning EEG findings. Needed vasopressors started due to hypotension from versed titration.   Objective   Blood pressure (!) 91/59, pulse 65, temperature 98.4 F (36.9 C), temperature source Bladder, resp. rate 20, height 6' (1.829 m), weight 86.9 kg, SpO2 93 %.    Vent Mode: PRVC FiO2 (%):  [40 %-50 %] 40 % Set Rate:  [20 bmp] 20 bmp Vt Set:  [620 mL] 620 mL PEEP:  [5 cmH20] 5 cmH20 Plateau Pressure:  [15 cmH20-17 cmH20] 17 cmH20   Intake/Output Summary (Last 24 hours) at January 19, 2020 0851 Last data filed at 01-19-2020 0700 Gross per 24 hour  Intake 2457.62 ml  Output 1034 ml  Net 1423.62 ml   Filed Weights   02/12/2020 1544 01/16/20 0500 2020/01/19 0500  Weight: 88.5 kg 86.1 kg 86.9 kg    Examination: General: critically ill appearing man lying in bed intubated, sedated on propofol and Versed HENT: Left eye bruise, lacerations from previous fall Lungs: breathing above the set rate on the vent, no significant dyssynchrony.  CTA B.  Minimal secretions from endotracheal tube. Cardiovascular:  Regular rate and rhythm, less ectopy.  No murmurs Abdomen: Thin, soft, nontender, nondistended Extremities: Minimal edema, no clubbing or cyanosis Neuro: RASS -5, intact pupillary reflexes no breathing of the vent.  No cough, gag, corneal reflexes.  Not withdrawing from pain in any extremity.  No response to verbal stimulation.  Examined on sedation due to seizure-like activity and abnormal EEG findings. Derm: Sutures in his lip, no rashes  Resolved Hospital Problem list     Assessment & Plan:  VF cardiac  arrest due to STEMI; prolonged QTc with torsades overnight -Maintain normothermia; previously rewarmed due to hemodynamic instability -Neuroprotective measures-head of bed greater than 30 degrees maintain normothermia, normoglycemia, normocapnia. -Telemetry monitoring; DNR in the event of arrest -Continue to monitor and optimize electrolytes -DAPT  -Unfortunately unable to minimize sedation for neuro prognostication.  Continued epileptiform spikes despite high-dose Versed and propofol.  Appreciate neurology's assistance.  Acute hypoxic respiratory failure due to cardiac arrest -Continue low tidal volume ventilation, 4 to 8 cc/kg ideal body weight with goal plateau less than 30 driving pressure less than 15-- meeting goals. -Daily SAT and SBT as tolerated.  Status currently precludes extubation. -VAP prevention protocol -Empiric Unasyn day #3 for pneumonia prophylaxis post chest compressions  HLD -continue atorvastatin  H/o HTN; currently controlled -Holding PTA antihypertensives  Lactic acidosis due to hypoperfusion during cardiac arrest-improving  Chronic anemia -Iron studies as an outpatient -Transfuse for hemoglobin less than 7 or hemodynamically significant bleeding. 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  They understand his guarded prognosis  with ongoing abnormal EEG findings despite escalating antiepileptic therapy.  Appreciate neurology's assistance with goals of care discussions.  I am concerned that his quality of life and ability to make a meaningful recovery are limited.   Best practice:  Diet: TF Pain/Anxiety/Delirium protocol (if indicated): fentanyl PRN VAP protocol (if indicated): yes DVT prophylaxis: heparin Startup GI prophylaxis: pantoprazole Glucose control: SSI Mobility: bedrest Code Status: full Family Communication: family updated at bedside Disposition: ICU  Labs   CBC: Recent Labs  Lab 01/18/2020 1523 02/06/2020 1751 02/04/2020 2003 01/28/2020 2005 01/16/20 0601 01/16/20 1149 Jan 23, 2020 0258  WBC 9.3  --   --  12.3* 10.8* 15.9* 12.6*  NEUTROABS 5.2  --   --   --   --   --   --   HGB 12.8*   < > 12.2* 12.1* 11.7* 13.7 11.7*  HCT 38.7*   < > 36.0* 37.0* 35.5* 41.6 34.9*  MCV 98.2  --   --  95.9 96.5 96.5 99.1  PLT 132*  --   --  199 152 205 161   < > = values in this interval not displayed.    Basic Metabolic Panel: Recent Labs  Lab 01/23/2020 1523 02/11/2020 1751 02/04/2020 1752 01/31/2020 2003 01/16/2020 2058 01/16/20 0601 01/16/20 1149 01-23-2020 0258  NA 139   < > 138 138  139  --  138 139 143  K 3.3*   < > 3.5 3.5  3.6  --  3.9 3.5 3.7  CL 106  --   --  103  --  106 106 108  CO2 24  --   --  21*  --  19* 19* 29  GLUCOSE 155*  --   --  255*  --  122* 156* 105*  BUN 34*  --   --  36*  --  33* 31* 34*  CREATININE 1.35*  --   --  1.77*  --  1.51* 1.48* 1.35*  CALCIUM 8.6*  --   --  8.6*  --  8.7* 8.8* 8.3*  MG  --   --   --   --  1.9 2.1 2.5* 2.3  PHOS  --   --   --   --   --  3.1  --  3.6   < > = values in this interval not displayed.   GFR: Estimated Creatinine Clearance: 56.7 mL/min (A) (by C-G formula based on SCr of 1.35 mg/dL (H)). Recent Labs  Lab 01/29/2020 1523 02/10/2020 1531 02/04/2020 2005 02/01/2020 2025 01/16/20 0601 01/16/20 1149 Feb 02, 2020 0258  WBC   < >  --  12.3*  --  10.8* 15.9*  12.6*  LATICACIDVEN  --  3.7*  --  2.8*  --  3.4*  --    < > = values in this interval not displayed.    Liver Function Tests: Recent Labs  Lab 02/08/2020 1523 01/16/20 0601 02-02-2020 0258  AST 170* 157* 102*  ALT 139* 131* 93*  ALKPHOS 45 37* 40  BILITOT 0.5 1.1 1.0  PROT 6.1* 5.4* 4.8*  ALBUMIN 3.5 3.1* 2.5*   No results for input(s): LIPASE, AMYLASE in the last 168 hours. No results for input(s): AMMONIA in the last 168 hours.  ABG    Component Value Date/Time   PHART 7.405 02/05/2020 1752   PCO2ART 38.1 02/01/2020 1752   PO2ART 540 (H) 01/18/2020 1752   HCO3 22.8 02/13/2020 2003   TCO2 24 01/16/2020 2003   ACIDBASEDEF 3.0 (H) 01/14/2020 2003   O2SAT 77.0 01/28/2020 2003     Coagulation Profile: Recent Labs  Lab 01/21/2020 1531 02/10/2020 2003  INR 1.1 1.2    Cardiac Enzymes: No results for input(s): CKTOTAL, CKMB, CKMBINDEX, TROPONINI in the last 168 hours.  HbA1C: Hgb A1c MFr Bld  Date/Time Value Ref Range Status  February 02, 2020 02:58 AM 5.7 (H) 4.8 - 5.6 % Final    Comment:    (NOTE) Pre diabetes:          5.7%-6.4%  Diabetes:              >6.4%  Glycemic control for   <7.0% adults with diabetes     CBG: Recent Labs  Lab 01/16/20 1524 01/16/20 2026 02/02/2020 0016 2020/02/02 0429 02/02/20 0757  GLUCAP 134* 119* 126* 100* 125*    This patient is critically ill with multiple organ system failure which requires frequent high complexity decision making, assessment, support, evaluation, and titration of therapies. This was completed through the application of advanced monitoring technologies and extensive interpretation of multiple databases. During this encounter critical care time was devoted to patient care services described in this note for 42 minutes.  Julian Hy, DO 02/02/20 2:36 PM [facility]

## 2020-02-14 NOTE — Progress Notes (Signed)
vLTM EEG complete. No skin breakdown 

## 2020-02-14 NOTE — Progress Notes (Addendum)
Brief Neuro update:  Plan is to increase Versed to burst suppression pattern on EEG. Unfortunately, he had drop in SBP with uptitration of Versed and had to be scaled back. He has stayed hypotensive even after decreasing Versed. RN to reach out to PCCM team to see if patient would benefit from pressors and further recs regarding hypotension.  [place] Pager Number [phone]

## 2020-02-14 NOTE — Progress Notes (Signed)
eLink Physician-Brief Progress Note Patient Name: Benjamin Montes DOB: 27-Dec-1950 MRN: [id number]   Date of Service  01/27/20  HPI/Events of Note  Request for pressors to maintain BP while increasing sedation to achieve burst suppression per neuro  eICU Interventions  Peripheral pressor order set placed     Intervention Category Major Interventions: Hypotension - evaluation and management  Margaretmary Lombard 2020/01/27, 2:27 AM

## 2020-02-14 NOTE — Death Summary Note (Addendum)
Physician Discharge Summary  Patient ID: Benjamin Montes MRN: [id number] DOB/AGE: [birth year] 69 y.o.  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 was emergently transferred to [facility] and taken to the Cath Lab for left heart catheterization and revascularization of his LAD.  He was initiated on TTM to 36 degrees, but suffered 2 torsade point cardiac arrests.  At this time TTM was discontinued and normothermia was maintained.  Initial mechanical ventilation renal function improved as did transaminase elevation with expectant management.  He developed severe myoclonus with difficult to suppress epileptiform discharges.  Given his poor neurologic prognosis family decided to follow the wishes outlined in his living well and chose to pursue comfort focused care.  He was terminally extubated on 02-05-23 and passed away at 18:51 on 02-05-20 with family at bedside.  Consults: Cardiology, neurology, PCCM  Significant Diagnostic Studies: Continuous EEG monitoring-persistent epileptiform spikes despite escalating benzodiazepines, propofol, Keppra  Treatments:  Antiarrhythmics, left heart catheterization with revascularization TTM DAPT, statin Mechanical ventilation Antibiotics Vasopressors Antiepileptics Continuous EEG monitoring Analgesia and sedation Supplemental nutrition    Disposition:  Funeral home of the family's choosing      Signed: Julian Hy Feb 05, 2020, 7:43 PM   Time of death entered incorrectly. Per nursing documentation, he passed away at 18:21 (not 18:51).  Julian Hy, DO 01/18/20 6:27 PM [place] [facility]

## 2020-02-14 NOTE — Procedures (Addendum)
Patient Name: Benjamin Montes  MRN: [id number]  Epilepsy Attending: Lora Havens  Referring Physician/Provider: Dr Noemi Chapel Duration: 01/16/2020 1657 to Feb 02, 2020 1618  Patient history: 69yo m s/p cardiac arrest. EEG to evaluate for seizure.  Level of alertness:  comatose  AEDs during EEG study: LEV, Propofol, versed  Technical aspects: This EEG study was done with scalp electrodes positioned according to the 10-20 International system of electrode placement. Electrical activity was acquired at a sampling rate of 500Hz  and reviewed with a high frequency filter of 70Hz  and a low frequency filter of 1Hz . EEG data were recorded continuously and digitally stored.   Description: EEG showed discontinuous pattern with brief 0.5-1 seconds of eeg suppression and bursts of highly epileptiform discharges. EEG was not reactive to noxious stimulation. Hyperventilation and photic stimulation were not performed.     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    [name] [name]

## 2020-02-14 NOTE — Progress Notes (Signed)
Subjective: Versed was uptitrated which led to hypotension and patient had to be started on pressors overnight.  EEG continues to show generalized highly epileptiform bursts.  ROS: Unable to obtain due to poor mental status  Examination  Vital signs in last 24 hours: Temp:  [97.9 F (36.6 C)-98.8 F (37.1 C)] 98.4 F (36.9 C) (10/04 0700) Pulse Rate:  [60-73] 65 (10/04 1300) Resp:  [19-25] 20 (10/04 1300) BP: (77-103)/(52-66) 90/53 (10/04 1300) SpO2:  [89 %-100 %] 94 % (10/04 1300) FiO2 (%):  [40 %-50 %] 40 % (10/04 1132) Weight:  [86.9 kg] 86.9 kg (10/04 0500)  General: lying in bed, not in apparent distress CVS: pulse-normal rate and rhythm RS: breathing comfortably, intubated Extremities: normal, warm Neuro: On Versed at 15 mL/h, propofol at 40 MCG per hour, pupils sluggish reacting to light, corneal reflex absent, gag reflex absent, does not withdraw to noxious stimuli in all 4 extremities  Basic Metabolic Panel: Recent Labs  Lab 01/18/2020 1523 01/24/2020 1523 02/07/2020 1751 01/21/2020 1752 02/11/2020 2003 02/07/2020 2003 01/24/2020 2058 01/16/20 0601 01/16/20 1149 01/23/20 0258  NA 139  --    < > 138 138  139  --   --  138 139 143  K 3.3*  --    < > 3.5 3.5  3.6  --   --  3.9 3.5 3.7  CL 106  --   --   --  103  --   --  106 106 108  CO2 24  --   --   --  21*  --   --  19* 19* 29  GLUCOSE 155*  --   --   --  255*  --   --  122* 156* 105*  BUN 34*  --   --   --  36*  --   --  33* 31* 34*  CREATININE 1.35*  --   --   --  1.77*  --   --  1.51* 1.48* 1.35*  CALCIUM 8.6*   < >  --   --  8.6*   < >  --  8.7* 8.8* 8.3*  MG  --   --   --   --   --   --  1.9 2.1 2.5* 2.3  PHOS  --   --   --   --   --   --   --  3.1  --  3.6   < > = values in this interval not displayed.    CBC: Recent Labs  Lab 02/08/2020 1523 01/18/2020 1751 02/10/2020 2003 01/29/2020 2005 01/16/20 0601 01/16/20 1149 01-23-20 0258  WBC 9.3  --   --  12.3* 10.8* 15.9* 12.6*  NEUTROABS 5.2  --   --   --   --   --    --   HGB 12.8*   < > 12.2* 12.1* 11.7* 13.7 11.7*  HCT 38.7*   < > 36.0* 37.0* 35.5* 41.6 34.9*  MCV 98.2  --   --  95.9 96.5 96.5 99.1  PLT 132*  --   --  199 152 205 161   < > = values in this interval not displayed.     Coagulation Studies: Recent Labs    01/29/2020 1531 01/19/2020 2003  LABPROT 13.6 15.1  INR 1.1 1.2    Imaging CT head without contrast 01/16/2020: No acute abnormality.   ASSESSMENT AND PLAN: 69 year old male who initially was brought into an open hospital with unresponsiveness  for 15 minutes and and A. fib.  Patient received 10 minutes of ACLS with ROSC.  EEG was obtained due to myoclonic seizures and showed highly epileptiform bursts.  Cardiac arrest Suspected hypoxic/anoxic brain injury Myoclonic status epilepticus Hyperglycemia AKI Hypoalbuminemia with hypoproteinemia Transaminitis Leukocytosis Anemia -Patient status post cardiac arrest with myoclonus in first 24 hours which is usually suggestive of severe brain injury and poor prognosis -Attempted to control myoclonus and epileptiform discharges on EEG with propofol and Versed.  However patient continues to have highly epileptiform discharges. -Discussed with family (wife, son and daughter) that at this point, I am concerned that patient has sustained severe neurologic brain injury with poor chances of meaningful recovery.  -Patient's family states patient had a living will and would not want long-term trach, PEG and nursing home care.  Recommendations -Updated Dr. Carlis Abbott regarding family's wishes to transition to comfort care -We will discontinue LTM EEG -Neurology with telemetry will if needed.  I have spent a total of  35  minutes with the patient reviewing hospital notes,  test results, labs and examining the patient as well as establishing an assessment and plan that was discussed personally with the patient's family.  > 50% of time was spent in direct patient care.   Zeb [facility] For questions after 5pm please refer to AMION to reach the Neurologist on call

## 2020-02-14 NOTE — Procedures (Signed)
Extubation Procedure Note  Patient Details:   Name: Vaughan Garfinkle DOB: May 01, 1950 MRN: [id number]   Airway Documentation:    Vent end date: 2020-01-29 Vent end time: 1801   Evaluation  O2 sats: currently acceptable Complications: Complications of extubation to comfort care Patient did tolerate procedure well. Bilateral Breath Sounds: Clear, Diminished   No   Patient extubated per order and family wishes to comfort care. Positive cuff leak was noted prior to extubation. Family are around bedside with patient. RT will continue to monitor.   Paublo Warshawsky Clyda Greener 01/29/2020, 6:04 PM

## 2020-02-14 DEATH — deceased

## 2021-05-29 IMAGING — DX DG CHEST 1V PORT
1 series · 2 of 2 positions shown · non-contrast
Comparison: Same day.

CLINICAL DATA: Status post CPR.

EXAM:
PORTABLE CHEST 1 VIEW

[Series 1: chest · 0.14mm/px · 2 of 2 slices shown]
[im 1/2]
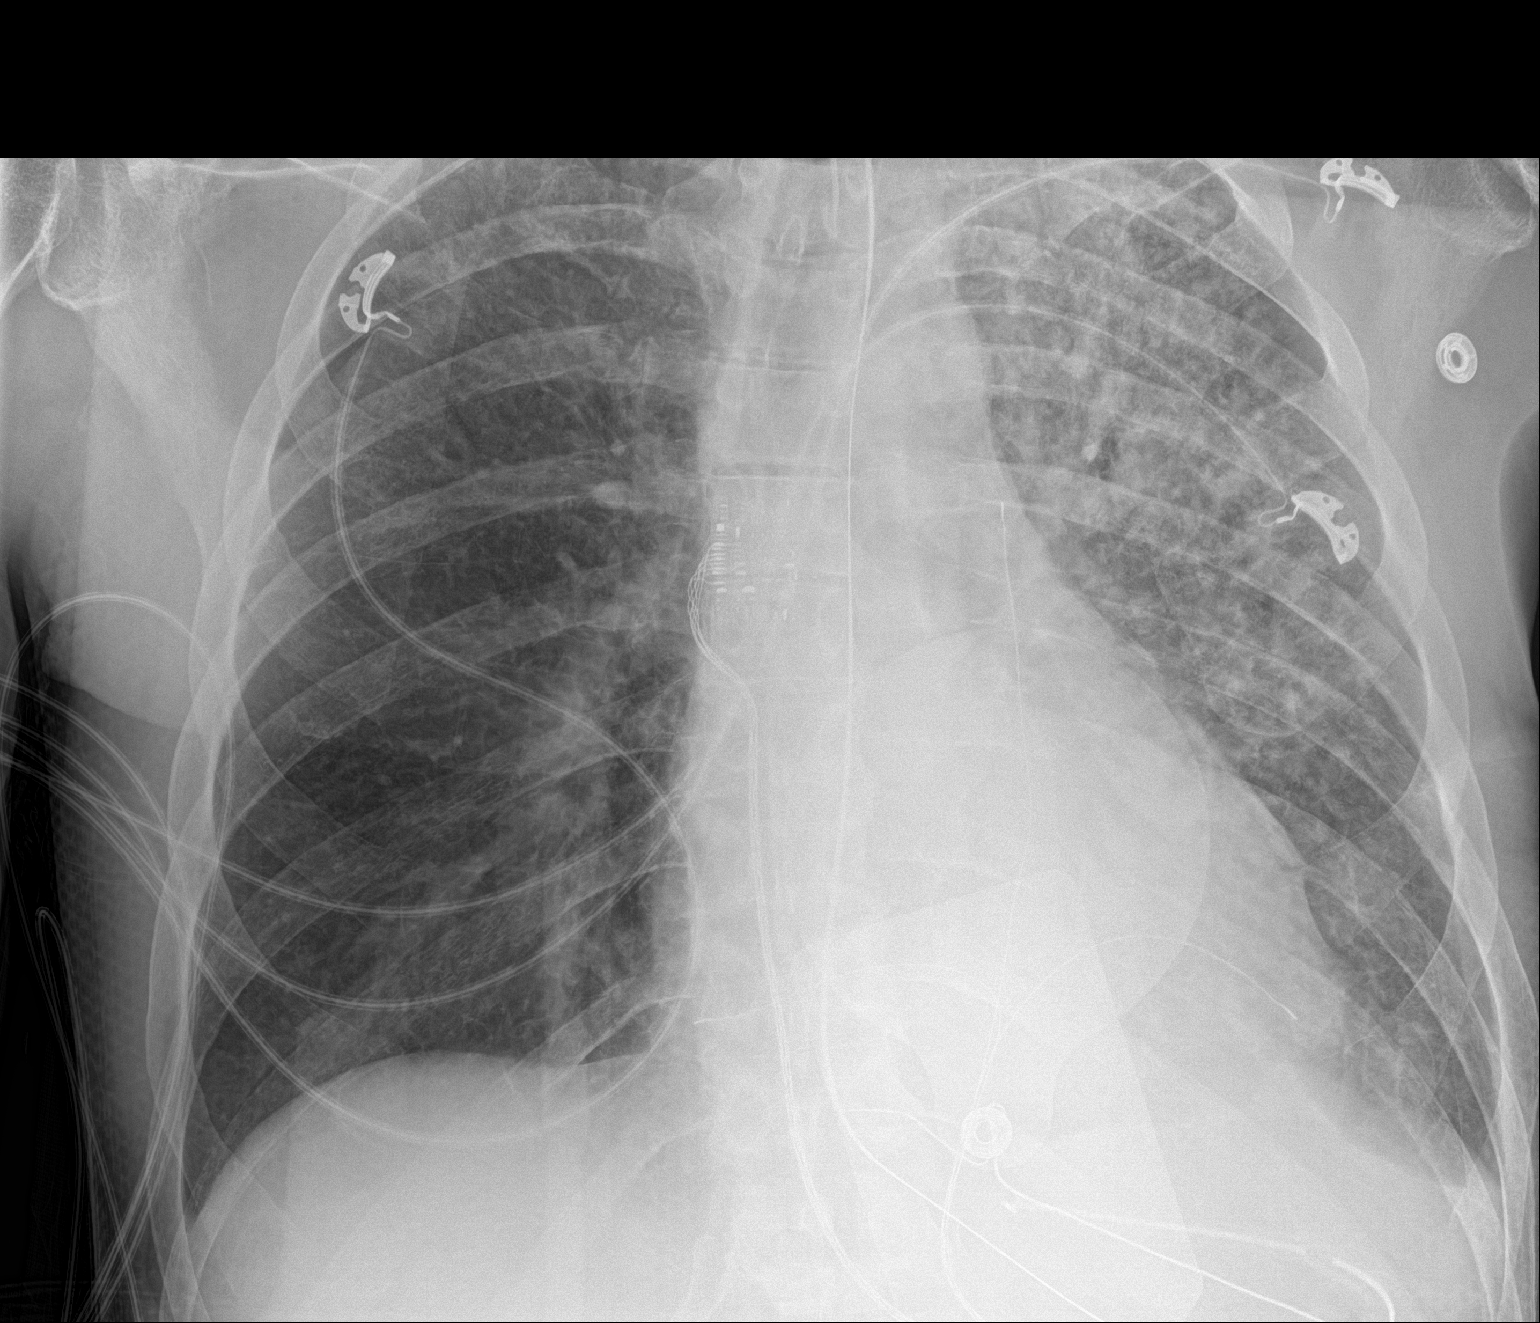
[im 2/2]
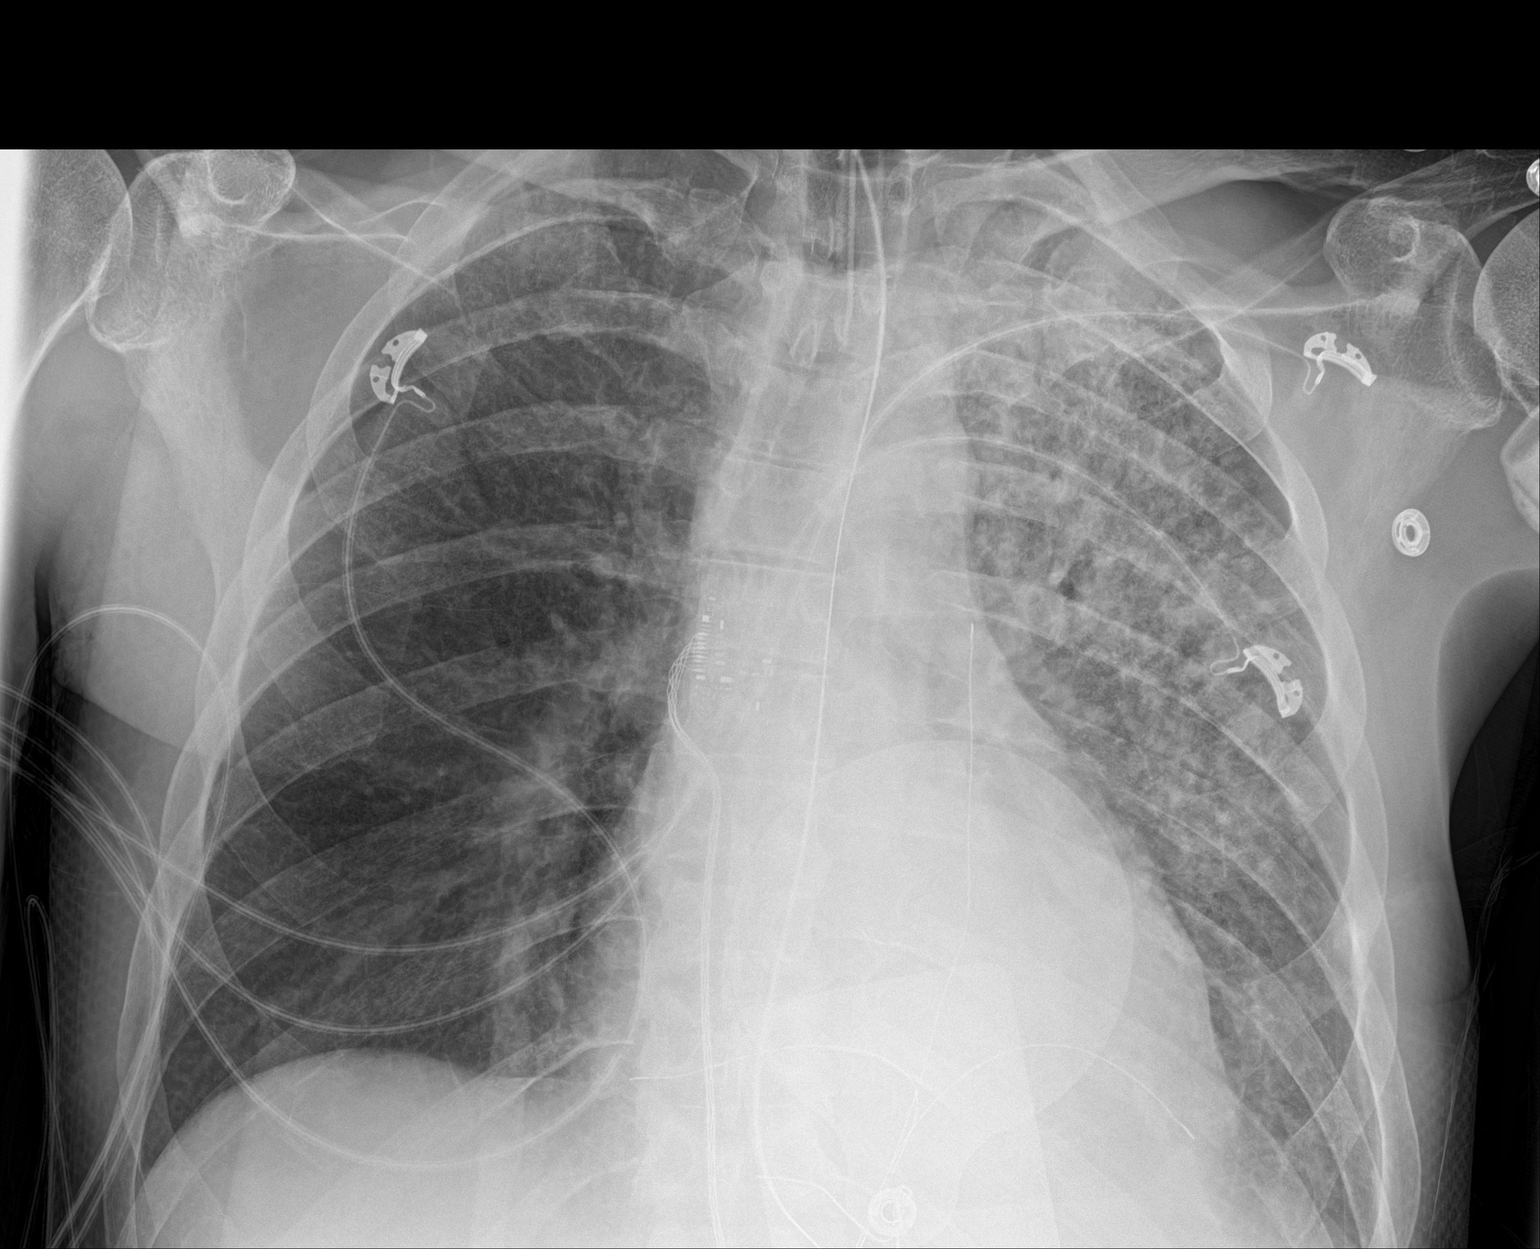

[2 of 2 positions shown; findings below may reference images not displayed]

FINDINGS: The heart size and mediastinal contours are within normal limits.
Endotracheal and nasogastric tubes are unchanged in position. No
pneumothorax is noted. Increased patchy airspace opacities are noted
in the left lung concerning for possible edema. Mild left pleural
effusion is noted. Right lung is clear. The visualized skeletal
structures are unremarkable.
IMPRESSION: Stable support apparatus. Increased patchy left lung airspace
opacities are noted concerning for possible edema. Mild left pleural
effusion is noted.
